# Patient Record
Sex: Female | Born: 1990
Health system: Southern US, Community
[De-identification: ages and names within clinical notes are randomized; demographics above are authoritative.]

## PROBLEM LIST (undated history)

## (undated) DIAGNOSIS — M5481 Occipital neuralgia: Secondary | ICD-10-CM

## (undated) DIAGNOSIS — G129 Spinal muscular atrophy, unspecified: Secondary | ICD-10-CM

## (undated) DIAGNOSIS — Z8742 Personal history of other diseases of the female genital tract: Secondary | ICD-10-CM

## (undated) DIAGNOSIS — Z8489 Family history of other specified conditions: Secondary | ICD-10-CM

## (undated) DIAGNOSIS — G35 Multiple sclerosis: Secondary | ICD-10-CM

## (undated) DIAGNOSIS — D649 Anemia, unspecified: Secondary | ICD-10-CM

## (undated) DIAGNOSIS — O09299 Supervision of pregnancy with other poor reproductive or obstetric history, unspecified trimester: Secondary | ICD-10-CM

## (undated) DIAGNOSIS — R87629 Unspecified abnormal cytological findings in specimens from vagina: Secondary | ICD-10-CM

## (undated) HISTORY — PX: ESOPHAGOGASTRODUODENOSCOPY (EGD) WITH PROPOFOL: SHX5813

## (undated) HISTORY — PX: NO PAST SURGERIES: SHX2092

---

## 2014-02-16 ENCOUNTER — Other Ambulatory Visit (HOSPITAL_COMMUNITY)
Admission: RE | Admit: 2014-02-16 | Discharge: 2014-02-16 | Disposition: A | Discharge status: Home / Self Care | Payer: BC Managed Care – PPO | Source: Ambulatory Visit | Attending: Obstetrics & Gynecology | Admitting: Obstetrics & Gynecology

## 2014-02-16 DIAGNOSIS — Z113 Encounter for screening for infections with a predominantly sexual mode of transmission: Secondary | ICD-10-CM | POA: Diagnosis present

## 2014-02-16 DIAGNOSIS — Z01411 Encounter for gynecological examination (general) (routine) with abnormal findings: Secondary | ICD-10-CM | POA: Diagnosis present

## 2014-10-28 ENCOUNTER — Other Ambulatory Visit: Payer: Self-pay | Admitting: Obstetrics & Gynecology

## 2014-10-28 ENCOUNTER — Other Ambulatory Visit (HOSPITAL_COMMUNITY)
Admission: RE | Admit: 2014-10-28 | Discharge: 2014-10-28 | Disposition: A | Discharge status: Home / Self Care | Payer: BC Managed Care – PPO | Source: Ambulatory Visit | Attending: Obstetrics & Gynecology | Admitting: Obstetrics & Gynecology

## 2014-10-28 DIAGNOSIS — N760 Acute vaginitis: Secondary | ICD-10-CM | POA: Insufficient documentation

## 2014-10-31 LAB — CERVICOVAGINAL ANCILLARY ONLY
BACTERIAL VAGINITIS: NEGATIVE
Candida vaginitis: NEGATIVE
Trichomonas: NEGATIVE

## 2015-09-22 DIAGNOSIS — G35 Multiple sclerosis: Secondary | ICD-10-CM | POA: Diagnosis not present

## 2015-11-28 DIAGNOSIS — H469 Unspecified optic neuritis: Secondary | ICD-10-CM | POA: Diagnosis not present

## 2015-12-27 DIAGNOSIS — A09 Infectious gastroenteritis and colitis, unspecified: Secondary | ICD-10-CM | POA: Diagnosis not present

## 2015-12-28 ENCOUNTER — Encounter (HOSPITAL_COMMUNITY): Payer: Self-pay | Admitting: Emergency Medicine

## 2015-12-28 ENCOUNTER — Emergency Department (HOSPITAL_COMMUNITY)
Admission: EM | Admit: 2015-12-28 | Discharge: 2015-12-28 | Disposition: A | Discharge status: Home / Self Care | Payer: BC Managed Care – PPO | Attending: Emergency Medicine | Admitting: Emergency Medicine

## 2015-12-28 DIAGNOSIS — R197 Diarrhea, unspecified: Secondary | ICD-10-CM | POA: Diagnosis not present

## 2015-12-28 DIAGNOSIS — R112 Nausea with vomiting, unspecified: Secondary | ICD-10-CM | POA: Diagnosis not present

## 2015-12-28 HISTORY — DX: Multiple sclerosis: G35

## 2015-12-28 LAB — URINALYSIS, ROUTINE W REFLEX MICROSCOPIC
Bilirubin Urine: NEGATIVE
GLUCOSE, UA: NEGATIVE mg/dL
HGB URINE DIPSTICK: NEGATIVE
Ketones, ur: NEGATIVE mg/dL
LEUKOCYTES UA: NEGATIVE
Nitrite: NEGATIVE
PH: 6 (ref 5.0–8.0)
Protein, ur: NEGATIVE mg/dL
SPECIFIC GRAVITY, URINE: 1.009 (ref 1.005–1.030)

## 2015-12-28 LAB — COMPREHENSIVE METABOLIC PANEL
ALT: 16 U/L (ref 14–54)
ANION GAP: 6 (ref 5–15)
AST: 22 U/L (ref 15–41)
Albumin: 4 g/dL (ref 3.5–5.0)
Alkaline Phosphatase: 52 U/L (ref 38–126)
BUN: 9 mg/dL (ref 6–20)
CALCIUM: 9.2 mg/dL (ref 8.9–10.3)
CO2: 24 mmol/L (ref 22–32)
Chloride: 107 mmol/L (ref 101–111)
Creatinine, Ser: 0.9 mg/dL (ref 0.44–1.00)
GFR calc non Af Amer: >60 mL/min (ref >60)
Glucose, Bld: 91 mg/dL (ref 65–99)
POTASSIUM: 4.1 mmol/L (ref 3.5–5.1)
Sodium: 137 mmol/L (ref 135–145)
Total Bilirubin: 0.6 mg/dL (ref 0.3–1.2)
Total Protein: 7.8 g/dL (ref 6.5–8.1)

## 2015-12-28 LAB — CBC
HEMATOCRIT: 36.4 % (ref 36.0–46.0)
HEMOGLOBIN: 11.8 g/dL — AB (ref 12.0–15.0)
MCH: 29.6 pg (ref 26.0–34.0)
MCHC: 32.4 g/dL (ref 30.0–36.0)
MCV: 91.2 fL (ref 78.0–100.0)
Platelets: 310 10*3/uL (ref 150–400)
RBC: 3.99 MIL/uL (ref 3.87–5.11)
RDW: 13.1 % (ref 11.5–15.5)
WBC: 6.8 10*3/uL (ref 4.0–10.5)

## 2015-12-28 LAB — I-STAT BETA HCG BLOOD, ED (MC, WL, AP ONLY): I-stat hCG, quantitative: <5 m[IU]/mL (ref <5)

## 2015-12-28 LAB — LIPASE, BLOOD: Lipase: 24 U/L (ref 11–51)

## 2015-12-28 MED ORDER — MECLIZINE HCL 25 MG PO TABS
25.0000 mg | ORAL_TABLET | Freq: Once | ORAL | Status: AC
  Administered 2015-12-28: 25 mg via ORAL
  Filled 2015-12-28: qty 1

## 2015-12-28 MED ORDER — ONDANSETRON 4 MG PO TBDP: ORAL_TABLET | ORAL | 0 refills | Status: DC

## 2015-12-28 MED ORDER — ONDANSETRON HCL 4 MG/2ML IJ SOLN
4.0000 mg | Freq: Once | INTRAMUSCULAR | Status: AC
  Administered 2015-12-28: 4 mg via INTRAVENOUS
  Filled 2015-12-28: qty 2

## 2015-12-28 MED ORDER — MECLIZINE HCL 25 MG PO TABS: 25.0000 mg | ORAL_TABLET | Freq: Three times a day (TID) | ORAL | 0 refills | Status: DC | PRN

## 2015-12-28 MED ORDER — SODIUM CHLORIDE 0.9 % IV BOLUS (SEPSIS)
1000.0000 mL | Freq: Once | INTRAVENOUS | Status: AC
  Administered 2015-12-28: 1000 mL via INTRAVENOUS

## 2015-12-28 NOTE — ED Provider Notes (Signed)
[facility]-EMERGENCY DEPT Provider Note   CSN: [id number] Arrival date & time: 12/28/15  1242     History   Chief Complaint Chief Complaint  Patient presents with  . Emesis    HPI Kimberly Strickland is a 25 y.o. female.  25 yo F with a chief complaint of nausea vomiting and diarrhea. Going on for the past 3 days. 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 She went to urgent care yesterday started on ciprofloxacin for possible traveler's diarrhea as well as azithromycin for possible cellulitis. Patient denies fevers or chills. Denies focal abdominal tenderness. Has some mild lower abdominal cramping that usually follow with diarrhea. Denies bilious or bloody emesis denies bloody or dark diarrhea. Has been feeling a little more lightheaded recently. Has a history of MS and is not sure if it is making her baseline dizziness worse.   The history is provided by the patient.  Emesis   This is a new problem. The current episode started more than 2 days ago. The problem occurs 2 to 4 times per day. The problem has not changed since onset.The emesis has an appearance of stomach contents. There has been no fever. Associated symptoms include diarrhea. Pertinent negatives include no arthralgias, no chills, no fever, no headaches and no myalgias.    Past Medical History:  Diagnosis Date  . MS (multiple sclerosis) (HCC)     There are no active problems to display for this patient.   No past surgical history on file.  OB History    No data available       Home Medications    Prior to Admission medications   Medication Sig Start Date End Date Taking? Authorizing Provider  azithromycin (ZITHROMAX) 500 MG tablet Take 500 mg by mouth daily. For 3 days, starting 09.20.2017. 12/27/15  Yes Historical Provider, MD  cholecalciferol (VITAMIN D) 1000 units tablet Take 5,000 Units by mouth daily.   Yes Historical Provider, MD  ciprofloxacin (CIPRO) 500 MG  tablet Take 500 mg by mouth 2 (two) times daily. For 7 days. 12/27/15   Historical Provider, MD  meclizine (ANTIVERT) 25 MG tablet Take 1 tablet (25 mg total) by mouth 3 (three) times daily as needed for dizziness. 12/28/15   Melene Plan, DO  ondansetron (ZOFRAN ODT) 4 MG disintegrating tablet 4mg  ODT q4 hours prn nausea/vomit 12/28/15   Melene Plan, DO    Family History No family history on file.  Social History Social History  Substance Use Topics  . Smoking status: Not on file  . Smokeless tobacco: Not on file  . Alcohol use Not on file     Allergies   Review of patient's allergies indicates no known allergies.   Review of Systems Review of Systems  Constitutional: Negative for chills and fever.  HENT: Negative for congestion and rhinorrhea.   Eyes: Negative for redness and visual disturbance.  Respiratory: Negative for shortness of breath and wheezing.   Cardiovascular: Negative for chest pain and palpitations.  Gastrointestinal: Positive for diarrhea, nausea and vomiting.  Genitourinary: Negative for dysuria and urgency.  Musculoskeletal: Negative for arthralgias and myalgias.  Skin: Negative for pallor and wound.  Neurological: Negative for dizziness and headaches.     Physical Exam Updated Vital Signs BP 100/61 (BP Location: Right Arm)   Pulse 67   Temp 98.1 F (36.7 C) (Oral)   Resp 18   Ht 5\' 8"  (1.727 m)   Wt 180 lb (81.6 kg)  LMP 12/21/2015   SpO2 100%   BMI 27.37 kg/m   Physical Exam  Constitutional: She is oriented to person, place, and time. She appears well-developed and well-nourished. No distress.  HENT:  Head: Normocephalic and atraumatic.  Eyes: EOM are normal. Pupils are equal, round, and reactive to light.  Neck: Normal range of motion. Neck supple.  Cardiovascular: Normal rate and regular rhythm.  Exam reveals no gallop and no friction rub.   No murmur heard. Pulmonary/Chest: Effort normal. She has no wheezes. She has no rales.  Abdominal:  Soft. She exhibits no distension and no mass. There is no tenderness. There is no guarding.  Mild diffuse lower abdominal tenderness.  Musculoskeletal: She exhibits no edema or tenderness.  Neurological: She is alert and oriented to person, place, and time.  Skin: Skin is warm and dry. She is not diaphoretic.  Psychiatric: She has a normal mood and affect. Her behavior is normal.  Nursing note and vitals reviewed.    ED Treatments / Results  Labs (all labs ordered are listed, but only abnormal results are displayed) Labs Reviewed  CBC - Abnormal; Notable for the following:       Result Value   Hemoglobin 11.8 (*)    All other components within normal limits  LIPASE, BLOOD  COMPREHENSIVE METABOLIC PANEL  URINALYSIS, ROUTINE W REFLEX MICROSCOPIC (NOT AT [facility])  I-STAT BETA HCG BLOOD, ED ([facility], [facility], [facility] ONLY)    EKG  EKG Interpretation None       Radiology No results found.  Procedures Procedures (including critical care time)  Medications Ordered in ED Medications  ondansetron (ZOFRAN) injection 4 mg (4 mg Intravenous Given 12/28/15 1732)  sodium chloride 0.9 % bolus 1,000 mL (1,000 mLs Intravenous New Bag/Given 12/28/15 1731)  meclizine (ANTIVERT) tablet 25 mg (25 mg Oral Given 12/28/15 1748)     Initial Impression / Assessment and Plan / ED Course  I have reviewed the triage vital signs and the nursing notes.  Pertinent labs & imaging results that were available during my care of the patient were reviewed by me and considered in my medical decision making (see chart for details).  Clinical Course    25 yo F With a chief complaint of nausea vomiting and diarrhea. I suspect this is a viral illness based on multiple sick contacts in her class. Will give Zofran IV fluids. Patient had labs done in triage that were unremarkable.  Tolerating by mouth well in the ED. Feeling significantly better after IV fluids. Discharge home.  6:53 PM:  I have discussed the  diagnosis/risks/treatment options with the patient and family and believe the pt to be eligible for discharge home to follow-up with PCP. We also discussed returning to the ED immediately if new or worsening sx occur. We discussed the sx which are most concerning (e.g., sudden worsening pain, fever, inability to tolerate by mouth) that necessitate immediate return. Medications administered to the patient during their visit and any new prescriptions provided to the patient are listed below.  Medications given during this visit Medications  ondansetron (ZOFRAN) injection 4 mg (4 mg Intravenous Given 12/28/15 1732)  sodium chloride 0.9 % bolus 1,000 mL (1,000 mLs Intravenous New Bag/Given 12/28/15 1731)  meclizine (ANTIVERT) tablet 25 mg (25 mg Oral Given 12/28/15 1748)     The patient appears reasonably screen and/or stabilized for discharge and I doubt any other medical condition or other [facility] requiring further screening, evaluation, or treatment in the ED at this time prior  to discharge.    Final Clinical Impressions(s) / ED Diagnoses   Final diagnoses:  Nausea vomiting and diarrhea    New Prescriptions New Prescriptions   MECLIZINE (ANTIVERT) 25 MG TABLET    Take 1 tablet (25 mg total) by mouth 3 (three) times daily as needed for dizziness.   ONDANSETRON (ZOFRAN ODT) 4 MG DISINTEGRATING TABLET    4mg  ODT q4 hours prn nausea/vomit     Melene Planan Marni Franzoni, DO 12/28/15 1853

## 2015-12-28 NOTE — Progress Notes (Signed)
Patient noted to have [facility] without a pcp.  EDCM spoke to patient at bedside.  EDCM provided patient with a list of [employer] providers within a twenty five mile radius of her zip code [id number]. Patient thankful for resources.  No further EDCM needs at this time.

## 2015-12-28 NOTE — ED Triage Notes (Signed)
Per pt, states vomiting for 3 days-unable to keep solids down-states she is not sure if this is related to her MS-abdominal cramping

## 2016-01-15 DIAGNOSIS — R200 Anesthesia of skin: Secondary | ICD-10-CM | POA: Diagnosis not present

## 2016-01-15 DIAGNOSIS — R29898 Other symptoms and signs involving the musculoskeletal system: Secondary | ICD-10-CM | POA: Diagnosis not present

## 2016-01-15 DIAGNOSIS — Z79899 Other long term (current) drug therapy: Secondary | ICD-10-CM | POA: Diagnosis not present

## 2016-01-15 DIAGNOSIS — H532 Diplopia: Secondary | ICD-10-CM | POA: Diagnosis not present

## 2016-01-15 DIAGNOSIS — R202 Paresthesia of skin: Secondary | ICD-10-CM | POA: Diagnosis not present

## 2016-01-15 DIAGNOSIS — G35 Multiple sclerosis: Secondary | ICD-10-CM | POA: Diagnosis not present

## 2016-02-10 DIAGNOSIS — G35 Multiple sclerosis: Secondary | ICD-10-CM | POA: Diagnosis not present

## 2016-02-13 DIAGNOSIS — H532 Diplopia: Secondary | ICD-10-CM | POA: Diagnosis not present

## 2016-02-13 DIAGNOSIS — G35 Multiple sclerosis: Secondary | ICD-10-CM | POA: Diagnosis not present

## 2016-02-13 DIAGNOSIS — Z79899 Other long term (current) drug therapy: Secondary | ICD-10-CM | POA: Diagnosis not present

## 2016-02-13 DIAGNOSIS — R202 Paresthesia of skin: Secondary | ICD-10-CM | POA: Diagnosis not present

## 2016-02-13 DIAGNOSIS — R29898 Other symptoms and signs involving the musculoskeletal system: Secondary | ICD-10-CM | POA: Diagnosis not present

## 2016-02-13 DIAGNOSIS — R200 Anesthesia of skin: Secondary | ICD-10-CM | POA: Diagnosis not present

## 2016-02-15 DIAGNOSIS — G35 Multiple sclerosis: Secondary | ICD-10-CM | POA: Diagnosis not present

## 2016-04-11 IMAGING — US US ABDOMEN LIMITED
1 series · 14 of 25 positions shown · non-contrast
Comparison: None.

CLINICAL DATA: Nausea, vomiting

EXAM:
ULTRASOUND ABDOMEN LIMITED RIGHT UPPER QUADRANT

[Series 1: us abdomen limited · 0.17mm/px · 14 of 35 slices shown]
[im 1/35]
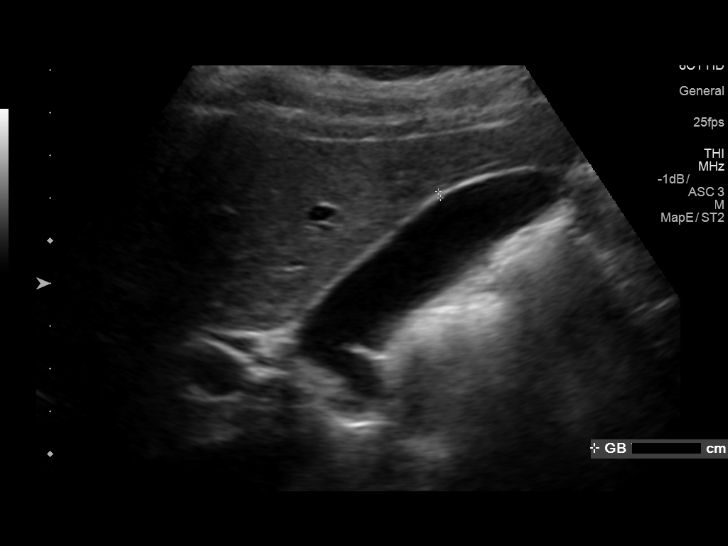
[im 3/35]
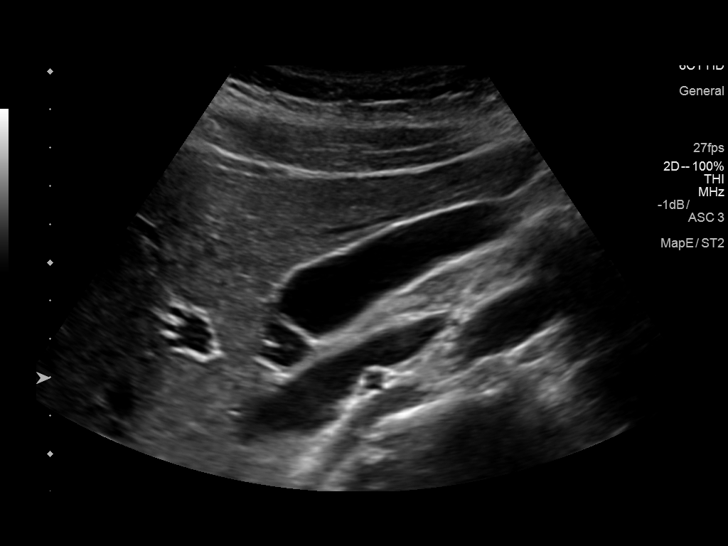
[im 6/35]
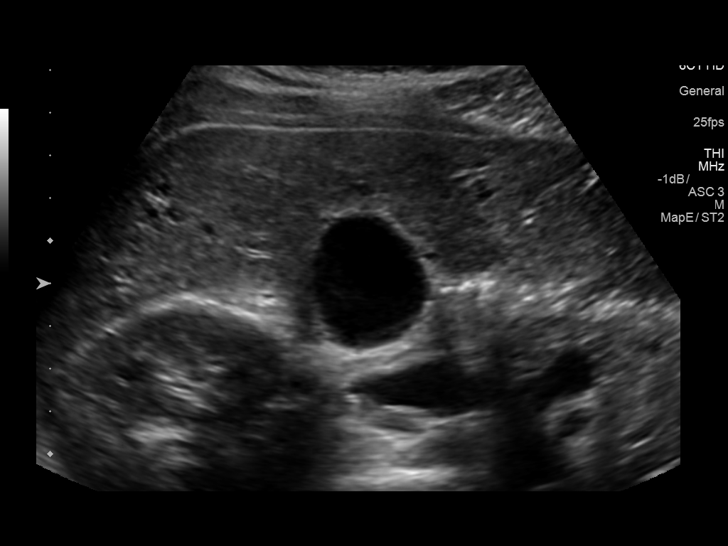
[im 9/35]
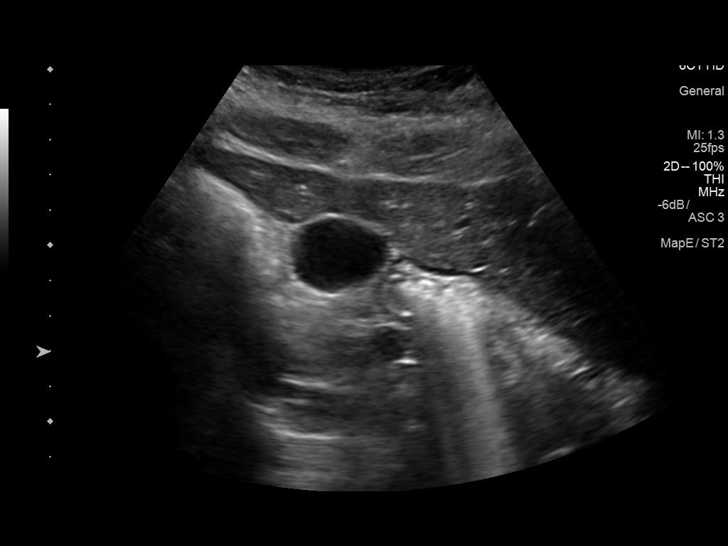
[im 12/35]
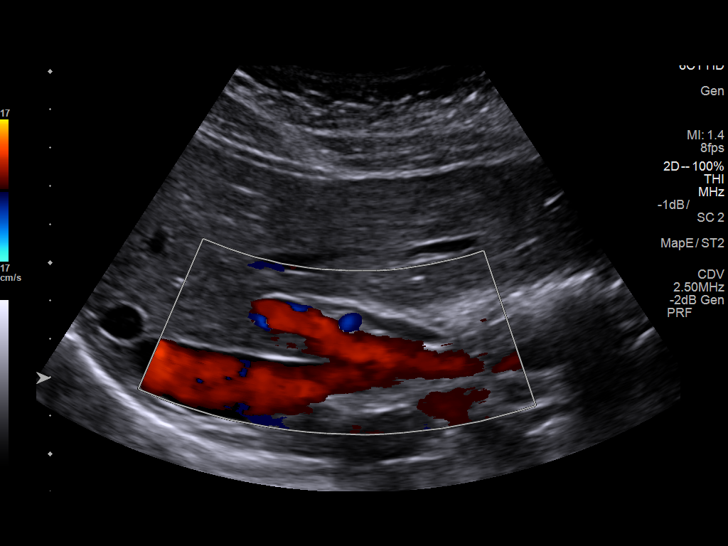
[im 13/35]
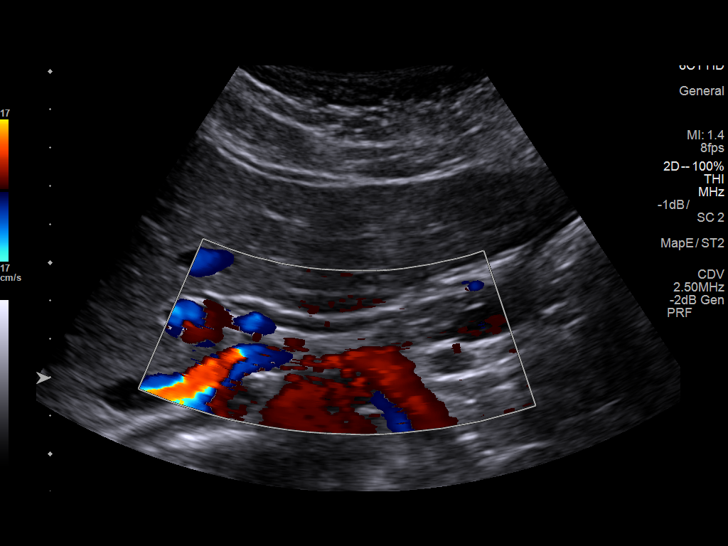
[im 16/35]
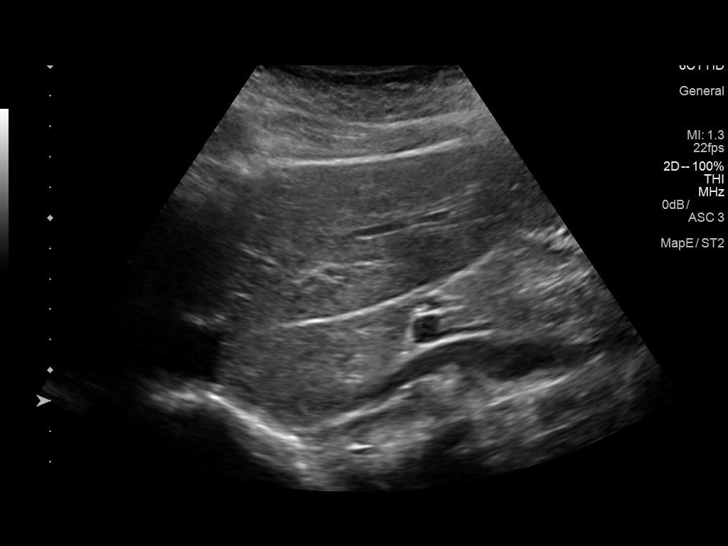
[im 19/35]
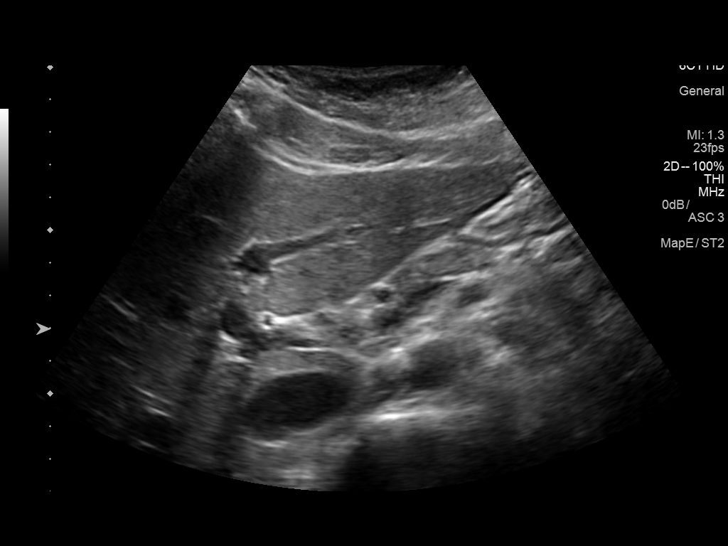
[im 22/35]
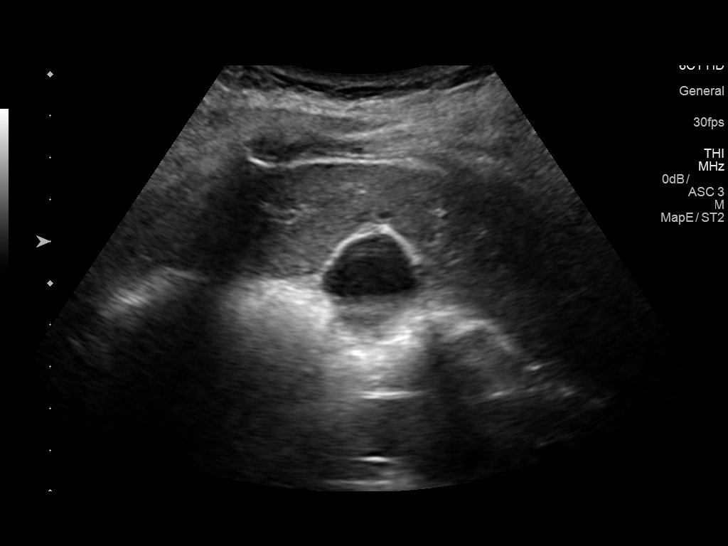
[im 23/35]
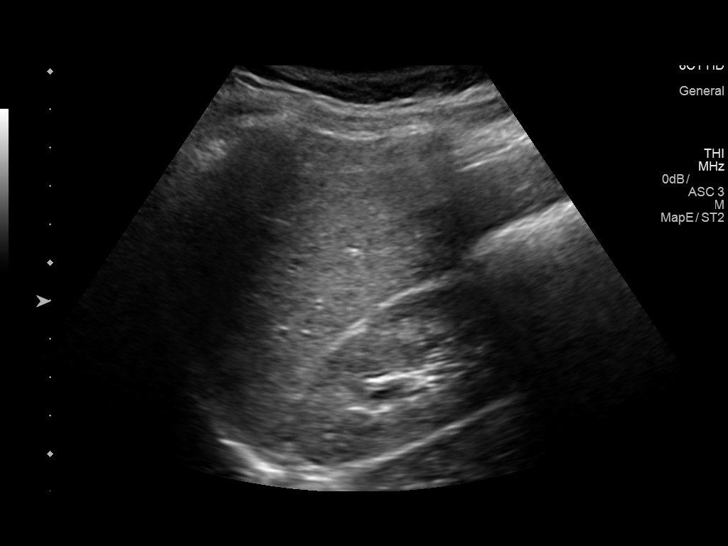
[im 26/35]
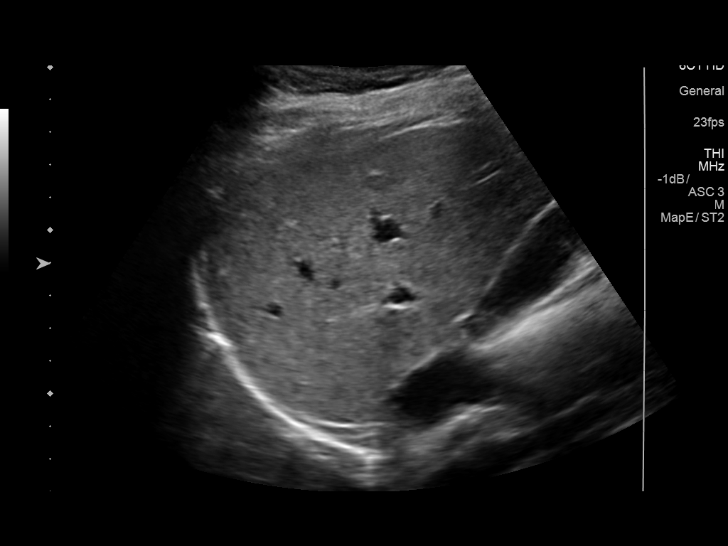
[im 29/35]
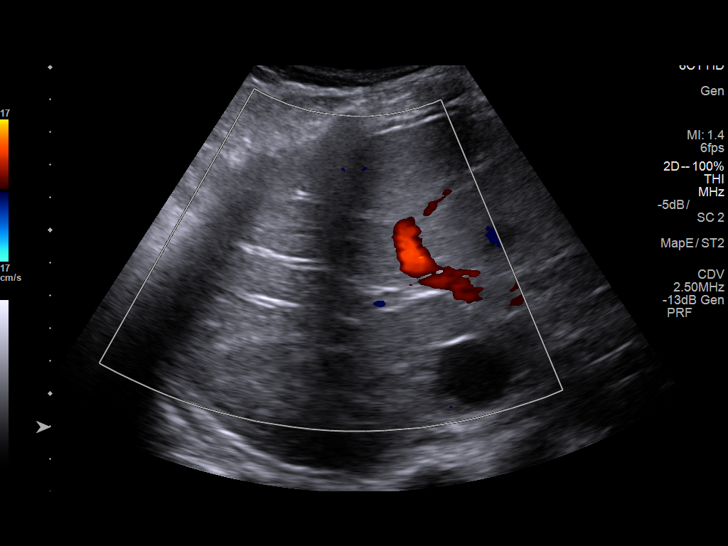
[im 32/35]
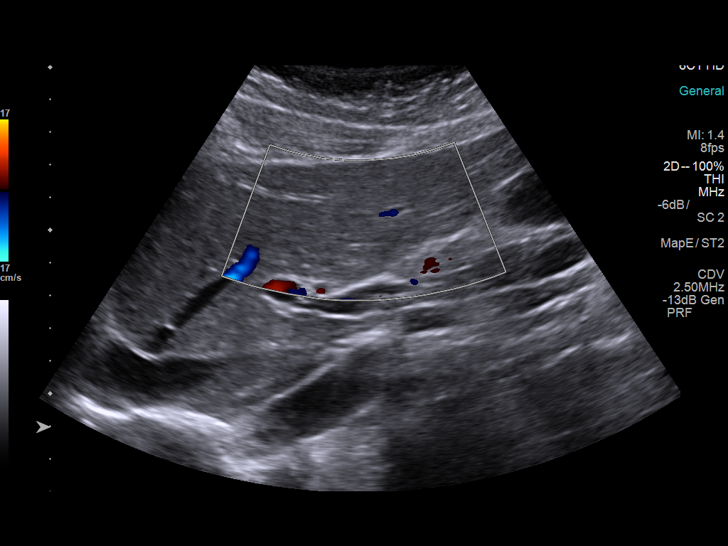
[im 35/35]
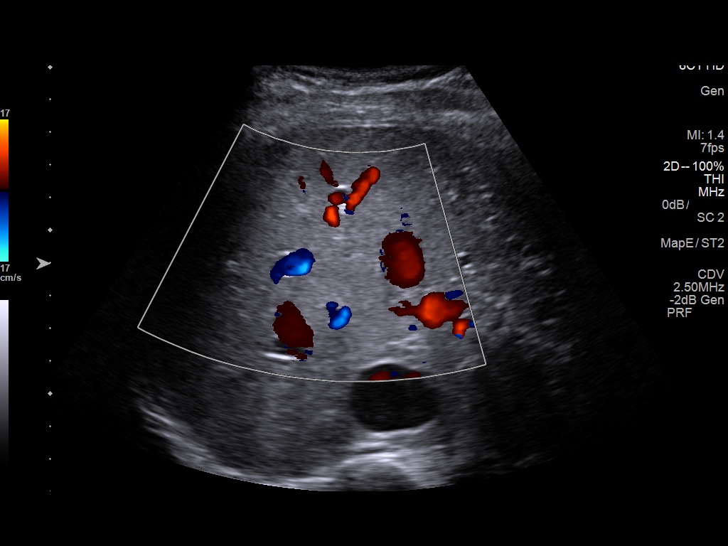

[14 of 25 positions shown; findings below may reference images not displayed]

FINDINGS: Gallbladder:

No gallstones or wall thickening visualized. No sonographic Murphy
sign noted by sonographer.

Common bile duct:

Diameter: Normal caliber, 3-4 mm.

Liver:

No focal lesion identified. Within normal limits in parenchymal
echogenicity. Mild intrahepatic biliary ductal dilatation.
Hepatopetal flow in the main portal vein.
IMPRESSION: Mild intrahepatic biliary duct dilatation. Recommend clinical
correlation with LFTs. Common bile duct is normal caliber.

## 2016-04-16 DIAGNOSIS — G35 Multiple sclerosis: Secondary | ICD-10-CM | POA: Diagnosis not present

## 2016-06-19 DIAGNOSIS — Z713 Dietary counseling and surveillance: Secondary | ICD-10-CM | POA: Diagnosis not present

## 2016-08-14 DIAGNOSIS — G35 Multiple sclerosis: Secondary | ICD-10-CM | POA: Diagnosis not present

## 2016-09-10 DIAGNOSIS — R1030 Lower abdominal pain, unspecified: Secondary | ICD-10-CM | POA: Diagnosis not present

## 2016-09-10 DIAGNOSIS — Z01411 Encounter for gynecological examination (general) (routine) with abnormal findings: Secondary | ICD-10-CM | POA: Diagnosis not present

## 2016-09-10 DIAGNOSIS — N760 Acute vaginitis: Secondary | ICD-10-CM | POA: Diagnosis not present

## 2016-09-10 DIAGNOSIS — G35 Multiple sclerosis: Secondary | ICD-10-CM | POA: Diagnosis not present

## 2016-09-10 DIAGNOSIS — Z01419 Encounter for gynecological examination (general) (routine) without abnormal findings: Secondary | ICD-10-CM | POA: Diagnosis not present

## 2016-09-10 DIAGNOSIS — D649 Anemia, unspecified: Secondary | ICD-10-CM | POA: Diagnosis not present

## 2016-09-10 DIAGNOSIS — Z6828 Body mass index (BMI) 28.0-28.9, adult: Secondary | ICD-10-CM | POA: Diagnosis not present

## 2016-09-10 DIAGNOSIS — R110 Nausea: Secondary | ICD-10-CM | POA: Diagnosis not present

## 2016-09-10 DIAGNOSIS — R269 Unspecified abnormalities of gait and mobility: Secondary | ICD-10-CM | POA: Diagnosis not present

## 2016-09-10 DIAGNOSIS — Z113 Encounter for screening for infections with a predominantly sexual mode of transmission: Secondary | ICD-10-CM | POA: Diagnosis not present

## 2016-09-10 DIAGNOSIS — N941 Unspecified dyspareunia: Secondary | ICD-10-CM | POA: Diagnosis not present

## 2016-09-10 DIAGNOSIS — R42 Dizziness and giddiness: Secondary | ICD-10-CM | POA: Diagnosis not present

## 2016-09-24 DIAGNOSIS — G35 Multiple sclerosis: Secondary | ICD-10-CM | POA: Diagnosis not present

## 2016-09-24 DIAGNOSIS — R001 Bradycardia, unspecified: Secondary | ICD-10-CM | POA: Diagnosis not present

## 2016-09-25 ENCOUNTER — Other Ambulatory Visit: Payer: Self-pay | Admitting: Physician Assistant

## 2016-09-25 DIAGNOSIS — G43A0 Cyclical vomiting, not intractable: Secondary | ICD-10-CM | POA: Diagnosis not present

## 2016-09-25 DIAGNOSIS — K219 Gastro-esophageal reflux disease without esophagitis: Secondary | ICD-10-CM | POA: Diagnosis not present

## 2016-09-25 DIAGNOSIS — R10811 Right upper quadrant abdominal tenderness: Secondary | ICD-10-CM

## 2016-09-25 DIAGNOSIS — K5904 Chronic idiopathic constipation: Secondary | ICD-10-CM | POA: Diagnosis not present

## 2016-09-25 DIAGNOSIS — R001 Bradycardia, unspecified: Secondary | ICD-10-CM | POA: Diagnosis not present

## 2016-09-25 DIAGNOSIS — D649 Anemia, unspecified: Secondary | ICD-10-CM | POA: Diagnosis not present

## 2016-10-01 DIAGNOSIS — R87610 Atypical squamous cells of undetermined significance on cytologic smear of cervix (ASC-US): Secondary | ICD-10-CM | POA: Diagnosis not present

## 2016-10-01 DIAGNOSIS — N870 Mild cervical dysplasia: Secondary | ICD-10-CM | POA: Diagnosis not present

## 2016-10-01 DIAGNOSIS — N72 Inflammatory disease of cervix uteri: Secondary | ICD-10-CM | POA: Diagnosis not present

## 2016-10-01 DIAGNOSIS — R87810 Cervical high risk human papillomavirus (HPV) DNA test positive: Secondary | ICD-10-CM | POA: Diagnosis not present

## 2016-10-02 ENCOUNTER — Ambulatory Visit
Admission: RE | Admit: 2016-10-02 | Discharge: 2016-10-02 | Disposition: A | Discharge status: Home / Self Care | Payer: BC Managed Care – PPO | Source: Ambulatory Visit | Attending: Physician Assistant | Admitting: Physician Assistant

## 2016-10-02 DIAGNOSIS — R10811 Right upper quadrant abdominal tenderness: Secondary | ICD-10-CM

## 2016-10-02 DIAGNOSIS — K838 Other specified diseases of biliary tract: Secondary | ICD-10-CM | POA: Diagnosis not present

## 2016-10-02 DIAGNOSIS — G43A0 Cyclical vomiting, not intractable: Secondary | ICD-10-CM

## 2016-10-04 DIAGNOSIS — R112 Nausea with vomiting, unspecified: Secondary | ICD-10-CM | POA: Diagnosis not present

## 2016-10-18 DIAGNOSIS — G43A0 Cyclical vomiting, not intractable: Secondary | ICD-10-CM | POA: Diagnosis not present

## 2016-10-18 DIAGNOSIS — K5904 Chronic idiopathic constipation: Secondary | ICD-10-CM | POA: Diagnosis not present

## 2016-10-18 DIAGNOSIS — K219 Gastro-esophageal reflux disease without esophagitis: Secondary | ICD-10-CM | POA: Diagnosis not present

## 2016-11-07 DIAGNOSIS — R42 Dizziness and giddiness: Secondary | ICD-10-CM | POA: Diagnosis not present

## 2016-11-14 DIAGNOSIS — G35 Multiple sclerosis: Secondary | ICD-10-CM | POA: Diagnosis not present

## 2016-12-25 ENCOUNTER — Other Ambulatory Visit (HOSPITAL_COMMUNITY): Payer: Self-pay | Admitting: Gastroenterology

## 2016-12-25 DIAGNOSIS — K5904 Chronic idiopathic constipation: Secondary | ICD-10-CM | POA: Diagnosis not present

## 2016-12-25 DIAGNOSIS — R112 Nausea with vomiting, unspecified: Secondary | ICD-10-CM | POA: Diagnosis not present

## 2016-12-25 DIAGNOSIS — R140 Abdominal distension (gaseous): Secondary | ICD-10-CM | POA: Diagnosis not present

## 2017-01-07 ENCOUNTER — Encounter (HOSPITAL_COMMUNITY)
Admission: RE | Admit: 2017-01-07 | Discharge: 2017-01-07 | Disposition: A | Discharge status: Home / Self Care | Payer: BC Managed Care – PPO | Source: Ambulatory Visit | Attending: Gastroenterology | Admitting: Gastroenterology

## 2017-01-07 DIAGNOSIS — R112 Nausea with vomiting, unspecified: Secondary | ICD-10-CM | POA: Insufficient documentation

## 2017-01-07 IMAGING — NM NM GASTRIC EMPTYING
5 series · 5 of 5 positions shown · non-contrast
Comparison: None.

CLINICAL DATA: Nausea, vomiting, and bloating since [DATE].

EXAM:
NUCLEAR MEDICINE GASTRIC EMPTYING SCAN
TECHNIQUE: After oral ingestion of radiolabeled meal, sequential abdominal
images were obtained for 4 hours. Percentage of activity emptying
the stomach was calculated at 1 hour, 2 hour, 3 hour, and 4 hours.
RADIOPHARMACEUTICALS:  2.0 mCi [JF] sulfur colloid in standardized
meal

[Series 1: 0 min · 4.14mm/px · 1 of 1 slices shown]
[im 1/1]
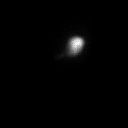

[Series 2: 1 hr · 4.14mm/px · 1 of 1 slices shown]
[im 1/1]
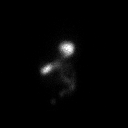

[Series 3: 2 hr · 4.14mm/px · 1 of 1 slices shown]
[im 1/1]
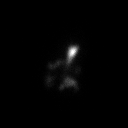

[Series 4: 3 hr · 4.14mm/px · 1 of 1 slices shown]
[im 1/1]
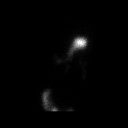

[Series 5: 4 hr · 4.14mm/px · 1 of 1 slices shown]
[im 1/1]
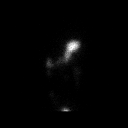

[5 of 5 positions shown; findings below may reference images not displayed]

FINDINGS: Expected location of the stomach in the left upper quadrant.
Ingested meal empties the stomach gradually over the course of the
study.

8% emptied at 1 hr ( normal >= 10%)

28% emptied at 2 hr ( normal >= 40%)

36% emptied at 3 hr ( normal >= 70%)

41% emptied at 4 hr ( normal >= 90%)
IMPRESSION: Delay gastric emptying study.

## 2017-01-07 MED ORDER — TECHNETIUM TC 99M SULFUR COLLOID
2.0000 | Freq: Once | INTRAVENOUS | Status: AC | PRN
  Administered 2017-01-07: 2 via ORAL

## 2017-01-08 DIAGNOSIS — R112 Nausea with vomiting, unspecified: Secondary | ICD-10-CM | POA: Diagnosis not present

## 2017-01-28 DIAGNOSIS — Z3685 Encounter for antenatal screening for Streptococcus B: Secondary | ICD-10-CM | POA: Diagnosis not present

## 2017-01-28 DIAGNOSIS — Z3401 Encounter for supervision of normal first pregnancy, first trimester: Secondary | ICD-10-CM | POA: Diagnosis not present

## 2017-01-28 DIAGNOSIS — Z3A08 8 weeks gestation of pregnancy: Secondary | ICD-10-CM | POA: Diagnosis not present

## 2017-01-28 DIAGNOSIS — O3680X0 Pregnancy with inconclusive fetal viability, not applicable or unspecified: Secondary | ICD-10-CM | POA: Diagnosis not present

## 2017-01-28 DIAGNOSIS — Z3480 Encounter for supervision of other normal pregnancy, unspecified trimester: Secondary | ICD-10-CM | POA: Diagnosis not present

## 2017-01-28 LAB — OB RESULTS CONSOLE RPR: RPR: NONREACTIVE

## 2017-01-28 LAB — OB RESULTS CONSOLE RUBELLA ANTIBODY, IGM: Rubella: IMMUNE

## 2017-01-28 LAB — OB RESULTS CONSOLE ABO/RH: RH TYPE: POSITIVE

## 2017-01-28 LAB — OB RESULTS CONSOLE HIV ANTIBODY (ROUTINE TESTING): HIV: NONREACTIVE

## 2017-01-28 LAB — OB RESULTS CONSOLE HEPATITIS B SURFACE ANTIGEN: Hepatitis B Surface Ag: NEGATIVE

## 2017-01-28 LAB — OB RESULTS CONSOLE ANTIBODY SCREEN: Antibody Screen: NEGATIVE

## 2017-02-17 DIAGNOSIS — Z3401 Encounter for supervision of normal first pregnancy, first trimester: Secondary | ICD-10-CM | POA: Diagnosis not present

## 2017-02-17 DIAGNOSIS — Z113 Encounter for screening for infections with a predominantly sexual mode of transmission: Secondary | ICD-10-CM | POA: Diagnosis not present

## 2017-03-04 DIAGNOSIS — Z3491 Encounter for supervision of normal pregnancy, unspecified, first trimester: Secondary | ICD-10-CM | POA: Diagnosis not present

## 2017-03-04 DIAGNOSIS — Z3A13 13 weeks gestation of pregnancy: Secondary | ICD-10-CM | POA: Diagnosis not present

## 2017-03-04 DIAGNOSIS — Z3682 Encounter for antenatal screening for nuchal translucency: Secondary | ICD-10-CM | POA: Diagnosis not present

## 2017-03-04 DIAGNOSIS — Z36 Encounter for antenatal screening for chromosomal anomalies: Secondary | ICD-10-CM | POA: Diagnosis not present

## 2017-03-08 ENCOUNTER — Telehealth: Payer: Self-pay | Admitting: Hematology

## 2017-03-08 ENCOUNTER — Encounter: Payer: Self-pay | Admitting: Hematology

## 2017-03-08 NOTE — Telephone Encounter (Signed)
Appt has been scheduled for the pt to see Dr. Candise CheKale on 12/17 at 10am. Letter faxed to Clydie BraunKaren at [facility] to notify the pt. Letter also mailed to the pt.

## 2017-03-24 ENCOUNTER — Ambulatory Visit: Payer: BLUE CROSS/BLUE SHIELD | Admitting: Hematology

## 2017-03-24 ENCOUNTER — Encounter: Payer: Self-pay | Admitting: Hematology

## 2017-03-24 VITALS — BP 93/59 | HR 86 | Temp 98.9°F | Resp 20 | Wt 176.8 lb

## 2017-03-24 DIAGNOSIS — D699 Hemorrhagic condition, unspecified: Secondary | ICD-10-CM

## 2017-03-24 DIAGNOSIS — Z3A16 16 weeks gestation of pregnancy: Secondary | ICD-10-CM

## 2017-03-24 DIAGNOSIS — N921 Excessive and frequent menstruation with irregular cycle: Secondary | ICD-10-CM

## 2017-03-24 DIAGNOSIS — Z3480 Encounter for supervision of other normal pregnancy, unspecified trimester: Secondary | ICD-10-CM | POA: Diagnosis not present

## 2017-03-24 NOTE — Progress Notes (Signed)
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   She reports that she was previously told in passing that she might have a bleeding issue and possible low FVIII levels by her doctor at [facility] medical practice while being followed for her [place] in [place], however, recently her obstetrician was concerned about the inheritability in her current pregnancy and wanted her to be followed by a hematologist.  She notes that she has never previously been evaluated by a hematologist.  Currently she is asymptomatic in this regard, and she has never had an issue with bleeding or bruising issues in her life. She has no known familial history with bleeding or clotting issues within her life. She has also been screened for the sickle cell trait and she does not carry this.  The patient reports that she is currently [redacted]wks pregnant and she states that overall this pregnancy has been normal mostly and she only has mild morning sickness currently. Prior to this pregnancy, she reports that she has always had issues with heavier periods, particularly from the 2nd-5th day and they typically last 7 days.   She denies any prior spontaneous epistaxis, GI bleeding, gingival bleeding, joint or skin bleeding, or any other bleeding issues. She has never undergone surgery prior to this pregnancy. No dental extractions.  She was formally diagnosed with MS in 2010. She reports that this initially began with numbness and paraesthesias within her hands and this has continued and only mildly worsened throughout the course of  her disease. This is well controlled with Copaxone daily. She is currently followed by a care team at both [facility] and [facility].   On review of systems, pt denies fever, chills, rash, mouth sores, weight loss, decreased appetite, urinary complaints. Denies back pain. Pt denies abdominal pain, nausea, vomiting. Pertinent positives are listed within the above HPI.   MEDICAL HISTORY:  Past Medical History:  Diagnosis Date  . MS (multiple sclerosis) (HCC)    SURGICAL HISTORY: History reviewed. No pertinent surgical history.  SOCIAL HISTORY: Social History   Socioeconomic History  . Marital status: Single    Spouse name: Not on file  . Number of children: Not on file  . Years of education: Not on file  . Highest education level: Not on file  Social Needs  . Financial resource strain: Not on file  . Food insecurity - worry: Not on file  . Food insecurity - inability: Not on file  . Transportation needs - medical: Not on file  . Transportation needs - non-medical: Not on file  Occupational History  . Not on file  Tobacco Use  . Smoking status: Former Smoker    Types: Cigars    Last attempt to quit: 2014    Years since quitting: 4.9  . Smokeless tobacco: Never Used  Substance and Sexual Activity  . Alcohol use: No    Frequency: Never  . Drug use: No  . Sexual activity: Yes    Birth control/protection: Other-see comments    Comment: Pregnancy  Other Topics Concern  . Not on file  Social History Narrative  .  Not on file   FAMILY HISTORY: History reviewed. No pertinent family history.  ALLERGIES:  has No Known Allergies.  MEDICATIONS:  Current Outpatient Medications  Medication Sig Dispense Refill  . Doxylamine-Pyridoxine (BONJESTA PO) Take 20 mg by mouth daily.    Marland Kitchen. azithromycin (ZITHROMAX) 500 MG tablet Take 500 mg by mouth daily. For 3 days, starting 09.20.2017.    . cholecalciferol (VITAMIN D) 1000 units tablet Take 5,000 Units by mouth daily.    .  ciprofloxacin (CIPRO) 500 MG tablet Take 500 mg by mouth 2 (two) times daily. For 7 days.    . meclizine (ANTIVERT) 25 MG tablet Take 1 tablet (25 mg total) by mouth 3 (three) times daily as needed for dizziness. (Patient not taking: Reported on 03/24/2017) 30 tablet 0  . ondansetron (ZOFRAN ODT) 4 MG disintegrating tablet 4mg  ODT q4 hours prn nausea/vomit (Patient not taking: Reported on 03/24/2017) 20 tablet 0   No current facility-administered medications for this visit.     REVIEW OF SYSTEMS:    A 10+ POINT REVIEW OF SYSTEMS WAS OBTAINED including neurology, dermatology, psychiatry, cardiac, respiratory, lymph, extremities, GI, GU, Musculoskeletal, constitutional, breasts, reproductive, HEENT.  All pertinent positives are noted in the HPI.  All others are negative.  PHYSICAL EXAMINATION: ECOG PERFORMANCE STATUS: 0 - Asymptomatic  . Vitals:   03/24/17 1031  BP: (!) 93/59  Pulse: 86  Resp: 20  Temp: 98.9 F (37.2 C)  SpO2: 100%   Filed Weights   03/24/17 1031  Weight: 176 lb 12.8 oz (80.2 kg)   .Body mass index is 26.88 kg/m.  GENERAL:alert, in no acute distress and comfortable SKIN: no acute rashes, no significant lesions EYES: conjunctiva are pink and non-injected, sclera anicteric OROPHARYNX: MMM, no exudates, no oropharyngeal erythema or ulceration NECK: supple, no JVD LYMPH:  no palpable lymphadenopathy in the cervical, axillary or inguinal regions LUNGS: clear to auscultation b/l with normal respiratory effort HEART: regular rate & rhythm ABDOMEN:  normoactive bowel sounds , non tender, not distended. Extremity: no pedal edema PSYCH: alert & oriented x 3 with fluent speech NEURO: no focal motor/sensory deficits  LABORATORY DATA:  I have reviewed the data as listed  . CBC Latest Ref Rng & Units 12/28/2015  WBC 4.0 - 10.5 K/uL 6.8  Hemoglobin 12.0 - 15.0 g/dL 11.8(L)  Hematocrit 36.0 - 46.0 % 36.4  Platelets 150 - 400 K/uL 310    . CMP Latest Ref Rng &  Units 12/28/2015  Glucose 65 - 99 mg/dL 91  BUN 6 - 20 mg/dL 9  Creatinine 1.610.44 - 0.961.00 mg/dL 0.450.90  Sodium 409135 - 811145 mmol/L 137  Potassium 3.5 - 5.1 mmol/L 4.1  Chloride 101 - 111 mmol/L 107  CO2 22 - 32 mmol/L 24  Calcium 8.9 - 10.3 mg/dL 9.2  Total Protein 6.5 - 8.1 g/dL 7.8  Total Bilirubin 0.3 - 1.2 mg/dL 0.6  Alkaline Phos 38 - 126 U/L 52  AST 15 - 41 U/L 22  ALT 14 - 54 U/L 16     RADIOGRAPHIC STUDIES: I have personally reviewed the radiological images as listed and agreed with the findings in the report. No results found.  ASSESSMENT & PLAN:  This is a wonderful 26 y.o. 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 -She has been referred to us by her OBGYN for further evaluation regarding this.  We do not have  her prior records from her doctors in [place].  -She is currently asymptomatic and has never had any issues with bruising/bleeding outside of heavier menses in the past. No forms of spontaneous bleeding and she does not have a family history of bleeding issues.  With this in mind, and with a lower bleeding risk score, I suspect that that she is more likely a carrier of this if anything at all.  Plan -We will order bleeding disorder screening to evaluate this further. If this is negative then we will follow up on an prn basis.  -she was recommended to request her OSH record reflecting any previous workup or concerns for bleeding issues. -Labs tomorrow . Orders Placed This Encounter  Procedures  . CBC & Diff and Retic    Standing Status:   Future    Standing Expiration Date:   03/24/2018  . Comprehensive metabolic panel    Standing Status:   Future    Standing Expiration Date:   03/24/2018  . APTT    Standing Status:   Future    Standing Expiration Date:   03/24/2018  . Protime-INR    Standing Status:   Future    Standing Expiration  Date:   03/24/2018  . Von Willebrand panel    Standing Status:   Future    Standing Expiration Date:   03/24/2018  . Factor 8 assay    Standing Status:   Future    Standing Expiration Date:   03/24/2018    RTC with Dr Candise Che prn based on results of labs testing.   All of the patients questions were answered with apparent satisfaction. The patient knows to call the clinic with any problems, questions or concerns.  I spent 40 minutes counseling the patient face to face. The total time spent in the appointment was 50 minutes and more than 50% was on counseling and direct patient cares.    Wyvonnia Lora MD MS AAHIVMS [facility] Hematology/Oncology Physician [facility]  (Office):       [phone] (Work cell):  [phone] (Fax):           [phone]  03/24/2017 10:46 AM   This document serves as a record of services personally performed by Wyvonnia Lora, MD. It was created on his behalf by Rosario Adie, a trained medical scribe. The creation of this record is based on the scribe's personal observations and the provider's statements to them.   .I have reviewed the above documentation for accuracy and completeness, and I agree with the above. Johney Maine MD MS

## 2017-03-26 ENCOUNTER — Telehealth: Payer: Self-pay | Admitting: Hematology

## 2017-03-26 NOTE — Telephone Encounter (Signed)
Per 12/17 los - RTC with Dr Candise Che prn

## 2017-04-08 NOTE — L&D Delivery Note (Addendum)
Delivery Note Delivery Note CTSP for precipitous delivery by CNM.  I arrived 1 minute after call. SVD viable female Apgars 9,9 over 1st degree ML lac.  I took over for the following: Placenta delivered spontaneously intact with 3VC. Repair with 2-0 Chromic with good support and hemostasis noted.  R/V exam confirms.  PH art was sent.   Mother and baby to couplet care and are doing well.  EBL 200cc  Candice Camp, MD

## 2017-04-08 NOTE — L&D Delivery Note (Signed)
Delivery Note Called by RN for standby delivery for Dr. Rana Snare. At 1235 I delivered a viable female infant via OA, presentation. Nuchal cord x1, loose, reduced, body cord x1, reduced. Infant placed on maternal abdomen. Dr. Rana Snare entered room and assumed care.  Donette Larry, CNM 08/25/2017 1:31 PM

## 2017-04-09 DIAGNOSIS — Z363 Encounter for antenatal screening for malformations: Secondary | ICD-10-CM | POA: Diagnosis not present

## 2017-04-09 DIAGNOSIS — Z3A18 18 weeks gestation of pregnancy: Secondary | ICD-10-CM | POA: Diagnosis not present

## 2017-04-24 ENCOUNTER — Encounter: Payer: BC Managed Care – PPO | Admitting: Hematology

## 2017-05-21 ENCOUNTER — Other Ambulatory Visit: Payer: Self-pay

## 2017-05-21 ENCOUNTER — Encounter: Payer: Self-pay | Admitting: Emergency Medicine

## 2017-05-21 ENCOUNTER — Ambulatory Visit: Payer: BLUE CROSS/BLUE SHIELD | Admitting: Emergency Medicine

## 2017-05-21 ENCOUNTER — Ambulatory Visit: Payer: Self-pay

## 2017-05-21 VITALS — BP 110/56 | HR 126 | Temp 99.5°F | Resp 16 | Ht 67.0 in | Wt 196.2 lb

## 2017-05-21 DIAGNOSIS — Z3A24 24 weeks gestation of pregnancy: Secondary | ICD-10-CM

## 2017-05-21 DIAGNOSIS — J22 Unspecified acute lower respiratory infection: Secondary | ICD-10-CM

## 2017-05-21 DIAGNOSIS — R509 Fever, unspecified: Secondary | ICD-10-CM | POA: Insufficient documentation

## 2017-05-21 DIAGNOSIS — Z8669 Personal history of other diseases of the nervous system and sense organs: Secondary | ICD-10-CM | POA: Diagnosis not present

## 2017-05-21 DIAGNOSIS — R6889 Other general symptoms and signs: Secondary | ICD-10-CM | POA: Diagnosis not present

## 2017-05-21 DIAGNOSIS — G35 Multiple sclerosis: Secondary | ICD-10-CM | POA: Insufficient documentation

## 2017-05-21 LAB — POCT CBC
Granulocyte percent: 85.4 %G — AB (ref 37–80)
HCT, POC: 27.3 % — AB (ref 37.7–47.9)
Hemoglobin: 8.9 g/dL — AB (ref 12.2–16.2)
Lymph, poc: 0.9 (ref 0.6–3.4)
MCH, POC: 29.6 pg (ref 27–31.2)
MCHC: 32.7 g/dL (ref 31.8–35.4)
MCV: 90.6 fL (ref 80–97)
MID (CBC): 1 — AB (ref 0–0.9)
MPV: 7.4 fL (ref 0–99.8)
PLATELET COUNT, POC: 286 10*3/uL (ref 142–424)
POC Granulocyte: 10.8 — AB (ref 2–6.9)
POC LYMPH %: 6.9 % — AB (ref 10–50)
POC MID %: 7.7 %M (ref 0–12)
RBC: 3.02 M/uL — AB (ref 4.04–5.48)
RDW, POC: 13.7 %
WBC: 12.6 10*3/uL — AB (ref 4.6–10.2)

## 2017-05-21 LAB — POC INFLUENZA A&B (BINAX/QUICKVUE)
INFLUENZA B, POC: NEGATIVE
Influenza A, POC: NEGATIVE

## 2017-05-21 MED ORDER — AZITHROMYCIN 250 MG PO TABS: ORAL_TABLET | ORAL | 0 refills | Status: DC

## 2017-05-21 NOTE — Telephone Encounter (Signed)
Patient called in and said "when I woke up from my nap, my temperature was up to 102." I asked is she taking tylenol, she said "I did around 12 today." I advised her that with the infection she has, she may have a fever for the next 24-48 hours while taking the antibiotics, continue taking the tylenol every 4-6 hours and her antibiotics. I advised that if she continues to have fevers after 48 hours, to call the office back. I advised if the fever is not going down at all while taking the medication, go to the nearest emergency room to be evaluated. She verbalized understanding.

## 2017-05-21 NOTE — Patient Instructions (Addendum)
     IF you received an x-ray today, you will receive an invoice from [facility]. Please contact [facility] at [phone] with questions or concerns regarding your invoice.   IF you received labwork today, you will receive an invoice from [facility]. Please contact [facility] at [phone] with questions or concerns regarding your invoice.   Our billing staff will not be able to assist you with questions regarding bills from these companies.  You will be contacted with the lab results as soon as they are available. The fastest way to get your results is to activate your My Chart account. Instructions are located on the last page of this paperwork. If you have not heard from us regarding the results in 2 weeks, please contact this office.     Acute Bronchitis, Adult Acute bronchitis is when air tubes (bronchi) in the lungs suddenly get swollen. The condition can make it hard to breathe. It can also cause these symptoms:  A cough.  Coughing up clear, yellow, or green mucus.  Wheezing.  Chest congestion.  Shortness of breath.  A fever.  Body aches.  Chills.  A sore throat.  Follow these instructions at home: Medicines  Take over-the-counter and prescription medicines only as told by your doctor.  If you were prescribed an antibiotic medicine, take it as told by your doctor. Do not stop taking the antibiotic even if you start to feel better. General instructions  Rest.  Drink enough fluids to keep your pee (urine) clear or pale yellow.  Avoid smoking and secondhand smoke. If you smoke and you need help quitting, ask your doctor. Quitting will help your lungs heal faster.  Use an inhaler, cool mist vaporizer, or humidifier as told by your doctor.  Keep all follow-up visits as told by your doctor. This is important. How is this prevented? To lower your risk of getting this condition again:  Wash your hands often with soap and water. If you cannot  use soap and water, use hand sanitizer.  Avoid contact with people who have cold symptoms.  Try not to touch your hands to your mouth, nose, or eyes.  Make sure to get the flu shot every year.  Contact a doctor if:  Your symptoms do not get better in 2 weeks. Get help right away if:  You cough up blood.  You have chest pain.  You have very bad shortness of breath.  You become dehydrated.  You faint (pass out) or keep feeling like you are going to pass out.  You keep throwing up (vomiting).  You have a very bad headache.  Your fever or chills gets worse. This information is not intended to replace advice given to you by your health care provider. Make sure you discuss any questions you have with your health care provider. Document Released: 09/11/2007 Document Revised: 11/01/2015 Document Reviewed: 09/13/2015 Elsevier Interactive Patient Education  2018 [employer]  

## 2017-05-21 NOTE — Progress Notes (Signed)
Kimberly Strickland 27 y.o.   Chief Complaint  Patient presents with  . Fever    per patient this morning 100 degrees  . Generalized Body Aches    HISTORY OF PRESENT ILLNESS: This is a 27 y.o. female with a history of multiple sclerosis.  Has been sick with flulike symptoms for 1 week.  Last night developed chills and cough turned productive.  Patient is 6 months pregnant.  Also complaining of generalized body aches and this morning she had a low-grade fever.  Denies any other significant symptoms.  Multiple people at work sick with the same.  HPI   Prior to Admission medications   Medication Sig Start Date End Date Taking? Authorizing Provider  Acetaminophen (TYLENOL EXTRA STRENGTH PO) Take by mouth.   Yes [provider]  Doxylamine-Pyridoxine (BONJESTA PO) Take 20 mg by mouth daily.   Yes [provider]  azithromycin (ZITHROMAX) 500 MG tablet Take 500 mg by mouth daily. For 3 days, starting 09.20.2017. 12/27/15   [provider]  cholecalciferol (VITAMIN D) 1000 units tablet Take 5,000 Units by mouth daily.    [provider]  ciprofloxacin (CIPRO) 500 MG tablet Take 500 mg by mouth 2 (two) times daily. For 7 days. 12/27/15   [provider]  meclizine (ANTIVERT) 25 MG tablet Take 1 tablet (25 mg total) by mouth 3 (three) times daily as needed for dizziness. Patient not taking: Reported on 03/24/2017 12/28/15   Melene Plan, DO  ondansetron (ZOFRAN ODT) 4 MG disintegrating tablet 4mg  ODT q4 hours prn nausea/vomit Patient not taking: Reported on 03/24/2017 12/28/15   Melene Plan, DO    No Known Allergies  There are no active problems to display for this patient.   Past Medical History:  Diagnosis Date  . MS (multiple sclerosis) (HCC)     No past surgical history on file.  Social History   Socioeconomic History  . Marital status: Single    Spouse name: Not on file  . Number of children: Not on file  . Years of education: Not on file    . Highest education level: Not on file  Social Needs  . Financial resource strain: Not on file  . Food insecurity - worry: Not on file  . Food insecurity - inability: Not on file  . Transportation needs - medical: Not on file  . Transportation needs - non-medical: Not on file  Occupational History  . Not on file  Tobacco Use  . Smoking status: Former Smoker    Types: Cigars    Last attempt to quit: 2014    Years since quitting: 5.1  . Smokeless tobacco: Never Used  Substance and Sexual Activity  . Alcohol use: No    Frequency: Never  . Drug use: No  . Sexual activity: Yes    Birth control/protection: Other-see comments    Comment: Pregnancy  Other Topics Concern  . Not on file  Social History Narrative  . Not on file    No family history on file.   Review of Systems  Constitutional: Positive for chills and fever.  HENT: Positive for congestion. Negative for sore throat.   Eyes: Negative.  Negative for discharge and redness.  Respiratory: Positive for cough and sputum production. Negative for hemoptysis, shortness of breath and wheezing.   Cardiovascular: Negative.  Negative for chest pain, palpitations and leg swelling.  Gastrointestinal: Negative.  Negative for abdominal pain, diarrhea, nausea and vomiting.  Genitourinary: Negative.  Negative for dysuria, frequency and  hematuria.  Musculoskeletal: Positive for myalgias. Negative for back pain, joint pain and neck pain.  Skin: Negative for rash.  Neurological: Negative.  Negative for dizziness and headaches.  Endo/Heme/Allergies: Negative.   All other systems reviewed and are negative.   Vitals:   05/21/17 1217  BP: (!) 110/56  Pulse: (!) 126  Resp: 16  Temp: 99.5 F (37.5 C)  SpO2: 97%    Physical Exam  Constitutional: She is oriented to person, place, and time. She appears well-developed and well-nourished.  HENT:  Head: Normocephalic and atraumatic.  Nose: Nose normal.  Mouth/Throat: Oropharynx is  clear and moist.  Eyes: Conjunctivae and EOM are normal. Pupils are equal, round, and reactive to light.  Neck: Normal range of motion. Neck supple. No JVD present. No thyromegaly present.  Cardiovascular: Normal rate, regular rhythm and normal heart sounds.  Pulmonary/Chest: Effort normal and breath sounds normal.  Abdominal:  Gravid nontender abdomen.  Musculoskeletal: Normal range of motion.  Lymphadenopathy:    She has no cervical adenopathy.  Neurological: She is alert and oriented to person, place, and time. No sensory deficit. She exhibits normal muscle tone.  Skin: Skin is warm and dry. Capillary refill takes less than 2 seconds. No rash noted.  Psychiatric: She has a normal mood and affect. Her behavior is normal.  Vitals reviewed.   Results for orders placed or performed in visit on 05/21/17 (from the past 24 hour(s))  POCT CBC     Status: Abnormal   Collection Time: 05/21/17 12:51 PM  Result Value Ref Range   WBC 12.6 (A) 4.6 - 10.2 K/uL   Lymph, poc 0.9 0.6 - 3.4   POC LYMPH PERCENT 6.9 (A) 10 - 50 %L   MID (cbc) 1.0 (A) 0 - 0.9   POC MID % 7.7 0 - 12 %M   POC Granulocyte 10.8 (A) 2 - 6.9   Granulocyte percent 85.4 (A) 37 - 80 %G   RBC 3.02 (A) 4.04 - 5.48 M/uL   Hemoglobin 8.9 (A) 12.2 - 16.2 g/dL   HCT, POC 16.1 (A) 09.6 - 47.9 %   MCV 90.6 80 - 97 fL   MCH, POC 29.6 27 - 31.2 pg   MCHC 32.7 31.8 - 35.4 g/dL   RDW, POC 04.5 %   Platelet Count, POC 286 142 - 424 K/uL   MPV 7.4 0 - 99.8 fL  POC Influenza A&B(BINAX/QUICKVUE)     Status: None   Collection Time: 05/21/17  1:01 PM  Result Value Ref Range   Influenza A, POC Negative Negative   Influenza B, POC Negative Negative    ASSESSMENT & PLAN:  Kimberly Strickland was seen today for fever and generalized body aches.  Diagnoses and all orders for this visit:  Lower respiratory infection  Flu-like symptoms -     POCT CBC -     POC Influenza A&B(BINAX/QUICKVUE)  Fever, unspecified fever cause -     POCT  CBC  History of multiple sclerosis  [redacted] weeks gestation of pregnancy  Other orders -     azithromycin (ZITHROMAX) 250 MG tablet; Sig as indicated    Patient Instructions       IF you received an x-ray today, you will receive an invoice from [facility]. Please contact [facility] at [phone] with questions or concerns regarding your invoice.   IF you received labwork today, you will receive an invoice from [place]. Please contact [facility] at [phone] with questions or concerns regarding your invoice.  Our billing staff will not be able to assist you with questions regarding bills from these companies.  You will be contacted with the lab results as soon as they are available. The fastest way to get your results is to activate your My Chart account. Instructions are located on the last page of this paperwork. If you have not heard from [place] regarding the results in 2 weeks, please contact this office.     Acute Bronchitis, Adult Acute bronchitis is when air tubes (bronchi) in the lungs suddenly get swollen. The condition can make it hard to breathe. It can also cause these symptoms:  A cough.  Coughing up clear, yellow, or green mucus.  Wheezing.  Chest congestion.  Shortness of breath.  A fever.  Body aches.  Chills.  A sore throat.  Follow these instructions at home: Medicines  Take over-the-counter and prescription medicines only as told by your doctor.  If you were prescribed an antibiotic medicine, take it as told by your doctor. Do not stop taking the antibiotic even if you start to feel better. General instructions  Rest.  Drink enough fluids to keep your pee (urine) clear or pale yellow.  Avoid smoking and secondhand smoke. If you smoke and you need help quitting, ask your doctor. Quitting will help your lungs heal faster.  Use an inhaler, cool mist vaporizer, or humidifier as told by your doctor.  Keep all follow-up  visits as told by your doctor. This is important. How is this prevented? To lower your risk of getting this condition again:  Wash your hands often with soap and water. If you cannot use soap and water, use hand sanitizer.  Avoid contact with people who have cold symptoms.  Try not to touch your hands to your mouth, nose, or eyes.  Make sure to get the flu shot every year.  Contact a doctor if:  Your symptoms do not get better in 2 weeks. Get help right away if:  You cough up blood.  You have chest pain.  You have very bad shortness of breath.  You become dehydrated.  You faint (pass out) or keep feeling like you are going to pass out.  You keep throwing up (vomiting).  You have a very bad headache.  Your fever or chills gets worse. This information is not intended to replace advice given to you by your health care provider. Make sure you discuss any questions you have with your health care provider. Document Released: 09/11/2007 Document Revised: 11/01/2015 Document Reviewed: 09/13/2015 Elsevier Interactive Patient Education  2018 Elsevier Inc.     Edwina Barth, MD [facility]

## 2017-05-23 ENCOUNTER — Telehealth: Payer: Self-pay | Admitting: Emergency Medicine

## 2017-05-23 NOTE — Telephone Encounter (Signed)
Ok to write note for this patient?

## 2017-05-23 NOTE — Telephone Encounter (Signed)
Copied from CRM [id number]. Topic: Inquiry >> May 23, 2017  1:58 PM Maia Petties wrote: Reason for CRM: pt is requesting work note writing her out 2/13-2/15. She states MD advised her to stay out of work for a few days. Pt is requesting that it be sent to mychart (I have sent her a link to sign up).

## 2017-05-23 NOTE — Telephone Encounter (Signed)
Yes

## 2017-06-03 DIAGNOSIS — Z862 Personal history of diseases of the blood and blood-forming organs and certain disorders involving the immune mechanism: Secondary | ICD-10-CM | POA: Diagnosis not present

## 2017-06-03 DIAGNOSIS — Z3480 Encounter for supervision of other normal pregnancy, unspecified trimester: Secondary | ICD-10-CM | POA: Diagnosis not present

## 2017-06-03 DIAGNOSIS — Z23 Encounter for immunization: Secondary | ICD-10-CM | POA: Diagnosis not present

## 2017-06-23 ENCOUNTER — Encounter: Payer: Self-pay | Admitting: *Deleted

## 2017-06-23 NOTE — Progress Notes (Signed)
Received fax from outside facility requesting advice on how to proceed regarding pt's Hgb levels.  Pt [redacted] weeks pregnant.  Fax placed on Dr. Clyda Greener desk, will wait for instructions to proceed.

## 2017-06-25 ENCOUNTER — Telehealth: Payer: Self-pay | Admitting: Hematology

## 2017-06-25 NOTE — Telephone Encounter (Signed)
   Labs were drawn with ObGyn team - patient did not have lab drawn as requested previously in our clinic. Based on available labs  FVIII levels and Von Willebrand panel show no evidence of Hemophilia A or overt Von Willebrand Disease. No discernible bleeding risk related to this. If increased bleeding issues would need to rpt VWD panel 2 months post partum to get baseline levels (levels during pregnancy would be higher than baseline). HGb 8.9 - likely Iron deficiency related to pregnant. -f/u and Mx per Ob-Gyn. Check ferritin/Iron profile. If iron deficient - recommend Iron polysaccharide 150mg  po BID to maintain ferritin >= 50. Cont prenatal vitamins. Re-consult if hgb dropping after 4 weeks of compliant po iron use.  Johney Maine  MD MS

## 2017-07-22 DIAGNOSIS — Z3A33 33 weeks gestation of pregnancy: Secondary | ICD-10-CM | POA: Diagnosis not present

## 2017-07-22 DIAGNOSIS — Z3480 Encounter for supervision of other normal pregnancy, unspecified trimester: Secondary | ICD-10-CM | POA: Diagnosis not present

## 2017-08-12 DIAGNOSIS — Z3480 Encounter for supervision of other normal pregnancy, unspecified trimester: Secondary | ICD-10-CM | POA: Diagnosis not present

## 2017-08-12 DIAGNOSIS — Z3685 Encounter for antenatal screening for Streptococcus B: Secondary | ICD-10-CM | POA: Diagnosis not present

## 2017-08-24 ENCOUNTER — Encounter (HOSPITAL_COMMUNITY): Payer: Self-pay

## 2017-08-24 ENCOUNTER — Inpatient Hospital Stay (HOSPITAL_COMMUNITY)
Admission: AD | Admit: 2017-08-24 | Discharge: 2017-08-27 | DRG: 807 | Disposition: A | Discharge status: Home / Self Care | Payer: BLUE CROSS/BLUE SHIELD | Source: Ambulatory Visit | Attending: Obstetrics and Gynecology | Admitting: Obstetrics and Gynecology

## 2017-08-24 ENCOUNTER — Other Ambulatory Visit: Payer: Self-pay

## 2017-08-24 DIAGNOSIS — Z820 Family history of epilepsy and other diseases of the nervous system: Secondary | ICD-10-CM | POA: Diagnosis not present

## 2017-08-24 DIAGNOSIS — Z3A38 38 weeks gestation of pregnancy: Secondary | ICD-10-CM | POA: Diagnosis not present

## 2017-08-24 DIAGNOSIS — D2261 Melanocytic nevi of right upper limb, including shoulder: Secondary | ICD-10-CM | POA: Diagnosis not present

## 2017-08-24 DIAGNOSIS — Z3490 Encounter for supervision of normal pregnancy, unspecified, unspecified trimester: Secondary | ICD-10-CM

## 2017-08-24 DIAGNOSIS — Z2882 Immunization not carried out because of caregiver refusal: Secondary | ICD-10-CM | POA: Diagnosis not present

## 2017-08-24 DIAGNOSIS — G35 Multiple sclerosis: Secondary | ICD-10-CM | POA: Diagnosis present

## 2017-08-24 DIAGNOSIS — D2361 Other benign neoplasm of skin of right upper limb, including shoulder: Secondary | ICD-10-CM | POA: Diagnosis not present

## 2017-08-24 DIAGNOSIS — O4292 Full-term premature rupture of membranes, unspecified as to length of time between rupture and onset of labor: Principal | ICD-10-CM | POA: Diagnosis present

## 2017-08-24 DIAGNOSIS — Z87891 Personal history of nicotine dependence: Secondary | ICD-10-CM | POA: Diagnosis not present

## 2017-08-24 DIAGNOSIS — L816 Other disorders of diminished melanin formation: Secondary | ICD-10-CM | POA: Diagnosis not present

## 2017-08-24 HISTORY — DX: Anemia, unspecified: D64.9

## 2017-08-24 HISTORY — DX: Unspecified abnormal cytological findings in specimens from vagina: R87.629

## 2017-08-24 LAB — CBC
HEMATOCRIT: 27.8 % — AB (ref 36.0–46.0)
Hemoglobin: 8.5 g/dL — ABNORMAL LOW (ref 12.0–15.0)
MCH: 26.6 pg (ref 26.0–34.0)
MCHC: 30.6 g/dL (ref 30.0–36.0)
MCV: 86.9 fL (ref 78.0–100.0)
PLATELETS: 256 10*3/uL (ref 150–400)
RBC: 3.2 MIL/uL — ABNORMAL LOW (ref 3.87–5.11)
RDW: 15.5 % (ref 11.5–15.5)
WBC: 10.9 10*3/uL — ABNORMAL HIGH (ref 4.0–10.5)

## 2017-08-24 LAB — OB RESULTS CONSOLE GBS: GBS: NEGATIVE

## 2017-08-24 LAB — TYPE AND SCREEN
ABO/RH(D): O POS
Antibody Screen: NEGATIVE

## 2017-08-24 LAB — ABO/RH: ABO/RH(D): O POS

## 2017-08-24 LAB — POCT FERN TEST: POCT FERN TEST: POSITIVE

## 2017-08-24 MED ORDER — LACTATED RINGERS IV SOLN
INTRAVENOUS | Status: DC
  Administered 2017-08-24: 17:00:00 via INTRAVENOUS

## 2017-08-24 MED ORDER — OXYCODONE-ACETAMINOPHEN 5-325 MG PO TABS: 2.0000 | ORAL_TABLET | ORAL | Status: DC | PRN

## 2017-08-24 MED ORDER — OXYTOCIN BOLUS FROM INFUSION: 500.0000 mL | Freq: Once | INTRAVENOUS | Status: DC

## 2017-08-24 MED ORDER — LACTATED RINGERS IV SOLN: 500.0000 mL | INTRAVENOUS | Status: DC | PRN

## 2017-08-24 MED ORDER — OXYCODONE-ACETAMINOPHEN 5-325 MG PO TABS: 1.0000 | ORAL_TABLET | ORAL | Status: DC | PRN

## 2017-08-24 MED ORDER — SOD CITRATE-CITRIC ACID 500-334 MG/5ML PO SOLN: 30.0000 mL | ORAL | Status: DC | PRN

## 2017-08-24 MED ORDER — FLEET ENEMA 7-19 GM/118ML RE ENEM: 1.0000 | ENEMA | RECTAL | Status: DC | PRN

## 2017-08-24 MED ORDER — LIDOCAINE HCL (PF) 1 % IJ SOLN
30.0000 mL | INTRAMUSCULAR | Status: DC | PRN
  Filled 2017-08-24: qty 30

## 2017-08-24 MED ORDER — ACETAMINOPHEN 325 MG PO TABS: 650.0000 mg | ORAL_TABLET | ORAL | Status: DC | PRN

## 2017-08-24 MED ORDER — ONDANSETRON HCL 4 MG/2ML IJ SOLN: 4.0000 mg | Freq: Four times a day (QID) | INTRAMUSCULAR | Status: DC | PRN

## 2017-08-24 MED ORDER — OXYTOCIN 40 UNITS IN LACTATED RINGERS INFUSION - SIMPLE MED: 2.5000 [IU]/h | INTRAVENOUS | Status: DC

## 2017-08-24 NOTE — H&P (Signed)
Denetria Fillyaw is a 27 y.o. female presenting for SROM. OB History    Gravida  1   Para      Term      Preterm      AB      Living        SAB      TAB      Ectopic      Multiple      Live Births             Past Medical History:  Diagnosis Date  . Anemia    with pregnancy  . MS (multiple sclerosis) (HCC)   . Vaginal Pap smear, abnormal    Past Surgical History:  Procedure Laterality Date  . NO PAST SURGERIES     Family History: family history is not on file. Social History:  reports that she quit smoking about 5 years ago. Her smoking use included cigars. She has never used smokeless tobacco. She reports that she drinks alcohol. She reports that she has current or past drug history. Drug: Marijuana. Frequency: 1.00 time per week.     Maternal Diabetes: No Genetic Screening: Normal Maternal Ultrasounds/Referrals: Normal Fetal Ultrasounds or other Referrals:  None Maternal Substance Abuse:  No Significant Maternal Medications:  None Significant Maternal Lab Results:  None Other Comments:  None  ROS History Dilation: 4 Effacement (%): 50 Station: -3 Exam by:: Gwendolyn Grant, RN  Blood pressure 121/75, pulse 83, temperature 97.8 F (36.6 C), temperature source Axillary, resp. rate 18, height 5\' 8"  (1.727 m), weight 213 lb 8 oz (96.8 kg), last menstrual period 12/13/2016. Exam Physical Exam  Constitutional: She is oriented to person, place, and time. She appears well-developed and well-nourished.  HENT:  Head: Normocephalic and atraumatic.  Neck: Normal range of motion. Neck supple.  Cardiovascular: Normal rate and regular rhythm.  Respiratory: Effort normal and breath sounds normal.  GI:  Term FH,FHR 132  Genitourinary:  Genitourinary Comments: 3/vtx, SROM>>>clear + fern  Musculoskeletal: Normal range of motion.  Neurological: She is alert and oriented to person, place, and time.    Prenatal labs: ABO, Rh: --/--/O POS (05/19 1706) Antibody: NEG  (05/19 1706) Rubella: Immune (10/23 0000) RPR: Nonreactive (10/23 0000)  HBsAg: Negative (10/23 0000)  HIV: Non-reactive (10/23 0000)  GBS: Negative (05/19 2004)   Assessment/Plan: Term IUP, SROM, latent labor, NEG GBS, pt currently declines pitocin, will ambulate and recheck @ [facility]   Kalyan Barabas M Shaquil Aldana 08/24/2017, 10:43 PM

## 2017-08-24 NOTE — Progress Notes (Signed)
GBS negative per Dr. Marcelle Overlie. Patient requests no pitocin at this time. Patient to labor without augmentation and RN to recheck cervix around 2200.

## 2017-08-24 NOTE — Progress Notes (Signed)
Minimal cervical change at 2230. Patient refuses pitocin at this time. Patient to ambulate and RN recheck cervix at 0000 per Dr. Marcelle Overlie.

## 2017-08-24 NOTE — MAU Note (Signed)
Pt states that at approx 1430 she felt a gush of fluid & has continued leaking since then.  States mild lower abdm cramping started at 1400 & has not gotten more frequent or intense since then.  Denies vag bleeding.  States +FM.

## 2017-08-25 ENCOUNTER — Encounter (HOSPITAL_COMMUNITY): Payer: Self-pay | Admitting: *Deleted

## 2017-08-25 LAB — RPR: RPR Ser Ql: NONREACTIVE

## 2017-08-25 MED ORDER — DIBUCAINE 1 % RE OINT: 1.0000 "application " | TOPICAL_OINTMENT | RECTAL | Status: DC | PRN

## 2017-08-25 MED ORDER — OXYTOCIN 40 UNITS IN LACTATED RINGERS INFUSION - SIMPLE MED
1.0000 m[IU]/min | INTRAVENOUS | Status: DC
  Administered 2017-08-25: 500 m[IU]/min via INTRAVENOUS

## 2017-08-25 MED ORDER — WITCH HAZEL-GLYCERIN EX PADS: 1.0000 "application " | MEDICATED_PAD | CUTANEOUS | Status: DC | PRN

## 2017-08-25 MED ORDER — ZOLPIDEM TARTRATE 5 MG PO TABS: 5.0000 mg | ORAL_TABLET | Freq: Every evening | ORAL | Status: DC | PRN

## 2017-08-25 MED ORDER — OXYTOCIN 40 UNITS IN LACTATED RINGERS INFUSION - SIMPLE MED
1.0000 m[IU]/min | INTRAVENOUS | Status: DC
  Administered 2017-08-25: 1 m[IU]/min via INTRAVENOUS
  Filled 2017-08-25: qty 1000

## 2017-08-25 MED ORDER — MEASLES, MUMPS & RUBELLA VAC ~~LOC~~ INJ: 0.5000 mL | INJECTION | Freq: Once | SUBCUTANEOUS | Status: DC

## 2017-08-25 MED ORDER — BENZOCAINE-MENTHOL 20-0.5 % EX AERO
1.0000 "application " | INHALATION_SPRAY | CUTANEOUS | Status: DC | PRN
  Administered 2017-08-26: 1 via TOPICAL
  Filled 2017-08-25: qty 56

## 2017-08-25 MED ORDER — ACETAMINOPHEN 325 MG PO TABS: 650.0000 mg | ORAL_TABLET | ORAL | Status: DC | PRN

## 2017-08-25 MED ORDER — ONDANSETRON HCL 4 MG/2ML IJ SOLN: 4.0000 mg | INTRAMUSCULAR | Status: DC | PRN

## 2017-08-25 MED ORDER — TERBUTALINE SULFATE 1 MG/ML IJ SOLN
0.2500 mg | Freq: Once | INTRAMUSCULAR | Status: DC | PRN
  Filled 2017-08-25: qty 1

## 2017-08-25 MED ORDER — ONDANSETRON HCL 4 MG PO TABS
4.0000 mg | ORAL_TABLET | ORAL | Status: DC | PRN
Start: 2017-08-25 — End: 2017-08-27

## 2017-08-25 MED ORDER — MEDROXYPROGESTERONE ACETATE 150 MG/ML IM SUSP: 150.0000 mg | INTRAMUSCULAR | Status: DC | PRN

## 2017-08-25 MED ORDER — SIMETHICONE 80 MG PO CHEW: 80.0000 mg | CHEWABLE_TABLET | ORAL | Status: DC | PRN

## 2017-08-25 MED ORDER — OXYCODONE-ACETAMINOPHEN 5-325 MG PO TABS: 2.0000 | ORAL_TABLET | ORAL | Status: DC | PRN

## 2017-08-25 MED ORDER — PRENATAL MULTIVITAMIN CH
1.0000 | ORAL_TABLET | Freq: Every day | ORAL | Status: DC
  Administered 2017-08-26 – 2017-08-27 (×2): 1 via ORAL
  Filled 2017-08-25 (×2): qty 1

## 2017-08-25 MED ORDER — COCONUT OIL OIL
1.0000 "application " | TOPICAL_OIL | Status: DC | PRN
  Administered 2017-08-25: 1 via TOPICAL
  Filled 2017-08-25: qty 120

## 2017-08-25 MED ORDER — SENNOSIDES-DOCUSATE SODIUM 8.6-50 MG PO TABS
2.0000 | ORAL_TABLET | ORAL | Status: DC
  Administered 2017-08-25 – 2017-08-26 (×2): 2 via ORAL
  Filled 2017-08-25 (×2): qty 2

## 2017-08-25 MED ORDER — OXYCODONE-ACETAMINOPHEN 5-325 MG PO TABS: 1.0000 | ORAL_TABLET | ORAL | Status: DC | PRN

## 2017-08-25 MED ORDER — TETANUS-DIPHTH-ACELL PERTUSSIS 5-2.5-18.5 LF-MCG/0.5 IM SUSP: 0.5000 mL | Freq: Once | INTRAMUSCULAR | Status: DC

## 2017-08-25 MED ORDER — IBUPROFEN 600 MG PO TABS
600.0000 mg | ORAL_TABLET | Freq: Four times a day (QID) | ORAL | Status: DC
  Administered 2017-08-25 – 2017-08-27 (×9): 600 mg via ORAL
  Filled 2017-08-25 (×8): qty 1

## 2017-08-25 MED ORDER — DIPHENHYDRAMINE HCL 25 MG PO CAPS: 25.0000 mg | ORAL_CAPSULE | Freq: Four times a day (QID) | ORAL | Status: DC | PRN

## 2017-08-25 NOTE — Lactation Note (Signed)
This note was copied from a baby's chart. Lactation Consultation Note  Patient Name: Kimberly Strickland Today's Date: 08/25/2017 Reason for consult: Initial assessment;1st time breastfeeding;Primapara;Early term 37-38.6wks  P1 mother whose infant is now 66 hours old.    Infant sleeping and breastfed an hour ago.  Mother states breastfeeding is going well and she has no questions/concerns.    I encouraged feeding 8-12 times/24 hours or earlier if baby shows feeding cues.  Reviewed feeding cues.  Continue to do STS and to watch for feeding cues.  FOB asking about sleeping positions and reminded parents to place baby always on a firm surface on her back for sleep.  Mom made aware of O/P services, breastfeeding support groups, community resources, and our phone # for post-discharge questions.  Mother will call for assistance as needed.  FOB present and supportive.   Maternal Data Formula Feeding for Exclusion: No Has patient been taught Hand Expression?: Yes Does the patient have breastfeeding experience prior to this delivery?: No  Feeding Feeding Type: Breast Fed Length of feed: 30 min  LATCH Score                   Interventions    Lactation Tools Discussed/Used     Consult Status Consult Status: Follow-up Date: 08/26/17 Follow-up type: In-patient    Kimberly Strickland R Ronson Hagins 08/25/2017, 11:24 PM

## 2017-08-25 NOTE — Progress Notes (Signed)
Patient ID: Kimberly Strickland, female   DOB: 12-Jul-1990, 27 y.o.   MRN: [id number] Pt has questions about management of labor (boyfriend and mother have concerns too)  VSSAF FHR 140s + accels  Cx not checked due to PROM  IUP at term PROM - now agreeing to pitocin Discussed pain management options and concerns.  Reviewed birthing plan.  All of their concerns and questions addressed.

## 2017-08-25 NOTE — Anesthesia Pain Management Evaluation Note (Signed)
  CR[facility] Pain Management Visit Note  Patient: Kimberly Strickland, 27 y.o., female  "Hello I am a member of the anesthesia team at [facility]. We have an anesthesia team available at all times to provide care throughout the hospital, including epidural management and anesthesia for C-section. I don't know your plan for the delivery whether it a natural birth, water birth, IV sedation, nitrous supplementation, doula or epidural, but we want to meet your pain goals."   1.Was your pain managed to your expectations on prior hospitalizations?   No   2.What is your expectation for pain management during this hospitalization?     Labor support without medications  3.How can we help you reach that goal? Be available  Record the patient's initial score and the patient's pain goal.   Pain: 1  Pain Goal: 10 [facility] wants you to be able to say your pain was always managed very well.  Edison Pace 08/25/2017

## 2017-08-25 NOTE — Progress Notes (Signed)
Discussed the risks of prolonged rupture of membranes with patient and family. Patient refuses pitocin for labor augmentation. Will continue to monitor for s/s of infection.

## 2017-08-25 NOTE — Progress Notes (Signed)
Minimal cervical change at 0537. Educated patient on benefits of labor augmentation with SROM and risks of prolonged ROM. Patient agrees to start pitocin at this time. Pitocin initiated at 0550.

## 2017-08-26 LAB — CBC
HEMATOCRIT: 23.9 % — AB (ref 36.0–46.0)
HEMOGLOBIN: 7.5 g/dL — AB (ref 12.0–15.0)
MCH: 27.5 pg (ref 26.0–34.0)
MCHC: 31.4 g/dL (ref 30.0–36.0)
MCV: 87.5 fL (ref 78.0–100.0)
Platelets: 220 10*3/uL (ref 150–400)
RBC: 2.73 MIL/uL — AB (ref 3.87–5.11)
RDW: 15.4 % (ref 11.5–15.5)
WBC: 13.4 10*3/uL — AB (ref 4.0–10.5)

## 2017-08-26 NOTE — Progress Notes (Signed)
Patient doing well No complaints.  BP 108/71 (BP Location: Right Arm)   Pulse 78   Temp 98.7 F (37.1 C) (Oral)   Resp 16   Ht 5\' 8"  (1.727 m)   Wt 96.8 kg (213 lb 8 oz)   LMP 12/13/2016 Comment: Periods come late from month to month  Breastfeeding? Unknown   BMI 32.46 kg/m  Results for orders placed or performed during the hospital encounter of 08/24/17 (from the past 24 hour(s))  CBC     Status: Abnormal   Collection Time: 08/26/17  5:40 AM  Result Value Ref Range   WBC 13.4 (H) 4.0 - 10.5 K/uL   RBC 2.73 (L) 3.87 - 5.11 MIL/uL   Hemoglobin 7.5 (L) 12.0 - 15.0 g/dL   HCT 40.9 (L) 73.5 - 32.9 %   MCV 87.5 78.0 - 100.0 fL   MCH 27.5 26.0 - 34.0 pg   MCHC 31.4 30.0 - 36.0 g/dL   RDW 92.4 26.8 - 34.1 %   Platelets 220 150 - 400 K/uL   Abdomen is soft and non tender Lochia wnl  PPD # 1  Doing well Routine care Discharge home tomorrow

## 2017-08-27 MED ORDER — FERROUS SULFATE 325 (65 FE) MG PO TABS: 325.0000 mg | ORAL_TABLET | Freq: Every day | ORAL | 3 refills | Status: DC

## 2017-08-27 MED ORDER — IBUPROFEN 600 MG PO TABS: 600.0000 mg | ORAL_TABLET | Freq: Four times a day (QID) | ORAL | 0 refills | Status: DC | PRN

## 2017-08-27 MED ORDER — ACETAMINOPHEN 325 MG PO TABS: 650.0000 mg | ORAL_TABLET | Freq: Four times a day (QID) | ORAL | 0 refills | Status: DC | PRN

## 2017-08-27 NOTE — Discharge Instructions (Signed)
Call office for an appointment in 1-2 weeks

## 2017-08-27 NOTE — Lactation Note (Signed)
This note was copied from a baby's chart. Lactation Consultation Note  Patient Name: Kimberly Strickland [id number] Date: 08/27/2017 Reason for consult: Follow-up assessment;Early term 37-38.6wks   P1, Baby 23 hours old and mother is excited because she is able to express more. Discussed pumping and going back to work. Baby recently breastfed.  Mother attempted while LC in room but baby was not interested. Mom encouraged to feed baby 8-12 times/24 hours and with feeding cues.  Reviewed engorgement care and monitoring voids/stools. Provided mother w/ manual pump. Mother's nipples are tender.  Suggest she apply ebm or coconut oil.   Maternal Data    Feeding Feeding Type: Breast Fed Length of feed: 17 min  LATCH Score                   Interventions Interventions: Coconut oil;Hand pump  Lactation Tools Discussed/Used     Consult Status Consult Status: Complete    Hardie Pulley 08/27/2017, 9:58 AM

## 2017-08-27 NOTE — Progress Notes (Signed)
Post Partum Day 2 Subjective: up ad lib, voiding, tolerating PO and + flatus  Ambulating well, no dizziness  Objective: Blood pressure 106/62, pulse 82, temperature 98.2 F (36.8 C), temperature source Oral, resp. rate 16, height 5\' 8"  (1.727 m), weight 213 lb 8 oz (96.8 kg), last menstrual period 12/13/2016, SpO2 100 %, unknown if currently breastfeeding.  Physical Exam:  General: alert, cooperative and no distress Lochia: appropriate Uterine Fundus: firm Incision: healing well DVT Evaluation: No evidence of DVT seen on physical exam.  Recent Labs    08/24/17 1706 08/26/17 0540  HGB 8.5* 7.5*  HCT 27.8* 23.9*    Assessment/Plan: Discharge home  FU 1-2 weeks in office   LOS: 3 days   Roselle Locus II 08/27/2017, 8:06 AM

## 2017-08-27 NOTE — Clinical Social Work Maternal (Signed)
CLINICAL SOCIAL WORK MATERNAL/CHILD NOTE  Patient Details  Name: Kimberly Strickland MRN: [id number] Date of Birth: Apr 22, 1990  Date:  08/27/2017  Clinical Social Worker Initiating Note:  Terri Piedra, LCSW Date/Time: Initiated:  08/27/17/0900     Child's Name:  Kimberly Strickland   Biological Parents:  Mother, Father(Milani Cragun and Kaziah Krizek)   Need for Interpreter:  None   Reason for Referral:  Current Substance Use/Substance Use During Pregnancy (THC noted in H&P)   Address:  [place] [address]    Phone number:  [phone] (home)     Additional phone number:   Household Members/Support Persons (HM/SP):   Household Member/Support Person 1   HM/SP Name Relationship DOB or Age  HM/SP -1 Hart Carwin Eisenhart-Hailey FOB/husband    HM/SP -2        HM/SP -3        HM/SP -4        HM/SP -5        HM/SP -6        HM/SP -7        HM/SP -8          Natural Supports (not living in the home):  Friends, Extended Family, Immediate Family(MOB reports having a good support system.)   Professional Supports: None(MOB has had a counselor in the past and is interested in finding a new counselor now.)   Employment:     Type of Work:     Education:      Homebound arranged:    Museum/gallery curator Resources:  Multimedia programmer   Other Resources:      Cultural/Religious Considerations Which May Impact Care: None stated.  Strengths:  Ability to meet basic needs , Home prepared for child    Psychotropic Medications:         Pediatrician:       Pediatrician List:   [facility]      Pediatrician Fax Number:    Risk Factors/Current Problems:  Mental Health Concerns (Upon chart review, CSW notes a hx of Anxiety.  MOB reports no THC use in pregnany.)   Cognitive State:  Able to Concentrate , Alert , Insightful , Linear Thinking , Goal Oriented     Mood/Affect:  Euthymic , Interested , Calm    CSW Assessment: CSW met with MOB in her first floor room/122 to offer support and complete assessment due to marijuana use.  Usage was not noted in [facility], but was documented in H&P.  CSW unaware from chart as to when use was.  CSW noted in PNR that MOB has a hx of Anxiety.  Upon arrival to MOB's room, FOB was asleep on the couch and MOB was settling sleeping baby in bassinet.  CSW explained role and asked if this was a good time to meet with her, understanding that discharge is planned for today.  She said yes.  CSW asked if we could talk about anything in front of FOB if he were to awaken while CSW is in the room.  She replied, "he's a turd."  CSW asked if she would rather step out of the room for privacy.  She said, "yes, but he's going to find out at some point."  CSW took MOB to a consult room to speak with her.  MOB was very pleasant and easy to engage. MOB informed CSW  that "turd" was used as a "term of endearment" and that her husband is "great."  She states that they are newlyweds, married in February.  She spoke about the frustrations and difficulties of this transition, although mostly it has been happy and wonderful.  She states it has been understandably hard also.  CSW validated and normalized her statements and feelings and acknowledged that becoming parents is a beautifully difficult transition in life as well.  MOB reports that most of her anxieties lately have been related to adjusting to married life as well as having MS.  She reports that she has positive coping mechanisms, such as pausing to take time to address her feelings and being quiet before saying something she doesn't mean.  She states she has had therapy in the past and is interested in restarting.  She states she looked up her old therapist, but found that he passed away a few years ago.  She was receptive to information on how to locate a new therapist.  She was engaged and  attentive to information given by CSW regarding PMADs vs Baby Blues and the importance of monitoring her emotions during the postpartum period and notifying a medical professional if concerns arise.  MOB states she has no trouble seeking mental health treatment if needed, and seemed appreciative of the conversation and information shared by CSW. CSW inquired about marijuana use noted in her chart.  She reports that she had no use in pregnancy.  CSW asked what she meant by "he's going to find out at some point," and asked if there was anything else MOB felt she would like to discuss with CSW.  MOB told CSW that she is open with FOB and that although she stepped out to speak with CSW, she would tell him about the conversation.  She declined need to discuss anything further and states no questions, concerns, or needs at this time.  Baby's UDS is negative.  CSW Plan/Description:  No Further Intervention Required/No Barriers to Discharge, Sudden Infant Death Syndrome (SIDS) Education, Other Information/Referral to [employer], Perinatal Mood and Anxiety Disorder (PMADs) Education, CSW Will Continue to Monitor Umbilical Cord Tissue Drug Screen Results and Make Report if Waldron Session, Honor 08/27/2017, 11:08 AM

## 2017-08-27 NOTE — Discharge Summary (Signed)
Obstetric Discharge Summary Reason for Admission: rupture of membranes Prenatal Procedures: none Intrapartum Procedures: spontaneous vaginal delivery Postpartum Procedures: none Complications-Operative and Postpartum: none Hemoglobin  Date Value Ref Range Status  08/26/2017 7.5 (L) 12.0 - 15.0 g/dL Final   HCT  Date Value Ref Range Status  08/26/2017 23.9 (L) 36.0 - 46.0 % Final    Physical Exam:  General: alert, cooperative and no distress Lochia: appropriate Uterine Fundus: firm Incision: healing well DVT Evaluation: No evidence of DVT seen on physical exam.  Discharge Diagnoses: Term Pregnancy-delivered  Discharge Information: Date: 08/27/2017 Activity: pelvic rest Diet: routine Medications: PNV, Ibuprofen and Iron Condition: stable Instructions: refer to practice specific booklet Discharge to: home   Newborn Data: Live born female  Birth Weight: 6 lb 10 oz (3005 g) APGAR: 9, 9  Newborn Delivery   Birth date/time:  08/25/2017 12:35:00 Delivery type:  Vaginal, Spontaneous     Home with mother.  Kimberly Strickland 08/27/2017, 8:10 AM

## 2017-10-02 DIAGNOSIS — Z1389 Encounter for screening for other disorder: Secondary | ICD-10-CM | POA: Diagnosis not present

## 2017-10-28 DIAGNOSIS — J069 Acute upper respiratory infection, unspecified: Secondary | ICD-10-CM | POA: Diagnosis not present

## 2017-11-06 DIAGNOSIS — K429 Umbilical hernia without obstruction or gangrene: Secondary | ICD-10-CM | POA: Diagnosis not present

## 2017-11-06 DIAGNOSIS — Z00121 Encounter for routine child health examination with abnormal findings: Secondary | ICD-10-CM | POA: Diagnosis not present

## 2017-11-06 DIAGNOSIS — Z23 Encounter for immunization: Secondary | ICD-10-CM | POA: Diagnosis not present

## 2017-11-06 DIAGNOSIS — D2361 Other benign neoplasm of skin of right upper limb, including shoulder: Secondary | ICD-10-CM | POA: Diagnosis not present

## 2017-12-09 DIAGNOSIS — Z79899 Other long term (current) drug therapy: Secondary | ICD-10-CM | POA: Diagnosis not present

## 2017-12-09 DIAGNOSIS — G35 Multiple sclerosis: Secondary | ICD-10-CM | POA: Diagnosis not present

## 2017-12-26 DIAGNOSIS — J069 Acute upper respiratory infection, unspecified: Secondary | ICD-10-CM | POA: Diagnosis not present

## 2017-12-26 DIAGNOSIS — R509 Fever, unspecified: Secondary | ICD-10-CM | POA: Diagnosis not present

## 2017-12-30 DIAGNOSIS — J219 Acute bronchiolitis, unspecified: Secondary | ICD-10-CM | POA: Diagnosis not present

## 2018-03-10 DIAGNOSIS — G35 Multiple sclerosis: Secondary | ICD-10-CM | POA: Diagnosis not present

## 2018-03-10 DIAGNOSIS — Z79899 Other long term (current) drug therapy: Secondary | ICD-10-CM | POA: Diagnosis not present

## 2018-03-10 DIAGNOSIS — Z87891 Personal history of nicotine dependence: Secondary | ICD-10-CM | POA: Diagnosis not present

## 2018-03-10 DIAGNOSIS — Z5181 Encounter for therapeutic drug level monitoring: Secondary | ICD-10-CM | POA: Diagnosis not present

## 2018-03-16 DIAGNOSIS — N911 Secondary amenorrhea: Secondary | ICD-10-CM | POA: Diagnosis not present

## 2018-03-22 DIAGNOSIS — G35 Multiple sclerosis: Secondary | ICD-10-CM | POA: Diagnosis not present

## 2018-04-09 DIAGNOSIS — R5383 Other fatigue: Secondary | ICD-10-CM | POA: Diagnosis not present

## 2018-04-09 DIAGNOSIS — Z3480 Encounter for supervision of other normal pregnancy, unspecified trimester: Secondary | ICD-10-CM | POA: Diagnosis not present

## 2018-04-09 DIAGNOSIS — R002 Palpitations: Secondary | ICD-10-CM | POA: Diagnosis not present

## 2018-04-09 DIAGNOSIS — R209 Unspecified disturbances of skin sensation: Secondary | ICD-10-CM | POA: Diagnosis not present

## 2018-04-09 DIAGNOSIS — Z79899 Other long term (current) drug therapy: Secondary | ICD-10-CM | POA: Diagnosis not present

## 2018-04-09 DIAGNOSIS — Z3685 Encounter for antenatal screening for Streptococcus B: Secondary | ICD-10-CM | POA: Diagnosis not present

## 2018-04-09 DIAGNOSIS — G35 Multiple sclerosis: Secondary | ICD-10-CM | POA: Diagnosis not present

## 2018-04-09 DIAGNOSIS — Z3481 Encounter for supervision of other normal pregnancy, first trimester: Secondary | ICD-10-CM | POA: Diagnosis not present

## 2018-04-09 DIAGNOSIS — R269 Unspecified abnormalities of gait and mobility: Secondary | ICD-10-CM | POA: Diagnosis not present

## 2018-04-09 DIAGNOSIS — Z3482 Encounter for supervision of other normal pregnancy, second trimester: Secondary | ICD-10-CM | POA: Diagnosis not present

## 2018-04-13 DIAGNOSIS — R000 Tachycardia, unspecified: Secondary | ICD-10-CM | POA: Diagnosis not present

## 2018-04-13 DIAGNOSIS — G35 Multiple sclerosis: Secondary | ICD-10-CM | POA: Diagnosis not present

## 2018-04-13 DIAGNOSIS — K219 Gastro-esophageal reflux disease without esophagitis: Secondary | ICD-10-CM | POA: Diagnosis not present

## 2018-04-13 DIAGNOSIS — Z0000 Encounter for general adult medical examination without abnormal findings: Secondary | ICD-10-CM | POA: Diagnosis not present

## 2018-04-14 DIAGNOSIS — Z3A12 12 weeks gestation of pregnancy: Secondary | ICD-10-CM | POA: Diagnosis not present

## 2018-04-14 DIAGNOSIS — Z3682 Encounter for antenatal screening for nuchal translucency: Secondary | ICD-10-CM | POA: Diagnosis not present

## 2018-04-15 DIAGNOSIS — Z0000 Encounter for general adult medical examination without abnormal findings: Secondary | ICD-10-CM | POA: Diagnosis not present

## 2018-04-15 DIAGNOSIS — R002 Palpitations: Secondary | ICD-10-CM | POA: Diagnosis not present

## 2018-04-16 DIAGNOSIS — Z331 Pregnant state, incidental: Secondary | ICD-10-CM | POA: Diagnosis not present

## 2018-04-16 DIAGNOSIS — Z124 Encounter for screening for malignant neoplasm of cervix: Secondary | ICD-10-CM | POA: Diagnosis not present

## 2018-04-16 DIAGNOSIS — Z3400 Encounter for supervision of normal first pregnancy, unspecified trimester: Secondary | ICD-10-CM | POA: Diagnosis not present

## 2018-04-16 DIAGNOSIS — Z3481 Encounter for supervision of other normal pregnancy, first trimester: Secondary | ICD-10-CM | POA: Diagnosis not present

## 2018-04-16 DIAGNOSIS — Z113 Encounter for screening for infections with a predominantly sexual mode of transmission: Secondary | ICD-10-CM | POA: Diagnosis not present

## 2018-05-12 DIAGNOSIS — G35 Multiple sclerosis: Secondary | ICD-10-CM | POA: Diagnosis not present

## 2018-05-12 DIAGNOSIS — R262 Difficulty in walking, not elsewhere classified: Secondary | ICD-10-CM | POA: Diagnosis not present

## 2018-05-21 DIAGNOSIS — Z363 Encounter for antenatal screening for malformations: Secondary | ICD-10-CM | POA: Diagnosis not present

## 2018-05-21 DIAGNOSIS — Z3A18 18 weeks gestation of pregnancy: Secondary | ICD-10-CM | POA: Diagnosis not present

## 2018-05-21 DIAGNOSIS — Z3480 Encounter for supervision of other normal pregnancy, unspecified trimester: Secondary | ICD-10-CM | POA: Diagnosis not present

## 2018-05-21 DIAGNOSIS — Z361 Encounter for antenatal screening for raised alphafetoprotein level: Secondary | ICD-10-CM | POA: Diagnosis not present

## 2018-05-22 DIAGNOSIS — Z3482 Encounter for supervision of other normal pregnancy, second trimester: Secondary | ICD-10-CM | POA: Diagnosis not present

## 2018-05-22 DIAGNOSIS — Z3481 Encounter for supervision of other normal pregnancy, first trimester: Secondary | ICD-10-CM | POA: Diagnosis not present

## 2018-05-22 DIAGNOSIS — R002 Palpitations: Secondary | ICD-10-CM | POA: Diagnosis not present

## 2018-05-23 ENCOUNTER — Observation Stay (HOSPITAL_COMMUNITY)
Admission: AD | Admit: 2018-05-23 | Discharge: 2018-05-24 | Disposition: A | Discharge status: Home / Self Care | Payer: BLUE CROSS/BLUE SHIELD | Source: Ambulatory Visit | Attending: Obstetrics and Gynecology | Admitting: Obstetrics and Gynecology

## 2018-05-23 ENCOUNTER — Other Ambulatory Visit: Payer: Self-pay

## 2018-05-23 ENCOUNTER — Encounter (HOSPITAL_COMMUNITY): Payer: Self-pay

## 2018-05-23 ENCOUNTER — Inpatient Hospital Stay (HOSPITAL_COMMUNITY): Payer: BLUE CROSS/BLUE SHIELD

## 2018-05-23 DIAGNOSIS — O26892 Other specified pregnancy related conditions, second trimester: Secondary | ICD-10-CM | POA: Insufficient documentation

## 2018-05-23 DIAGNOSIS — Z791 Long term (current) use of non-steroidal anti-inflammatories (NSAID): Secondary | ICD-10-CM | POA: Insufficient documentation

## 2018-05-23 DIAGNOSIS — G35 Multiple sclerosis: Secondary | ICD-10-CM | POA: Diagnosis not present

## 2018-05-23 DIAGNOSIS — O364XX0 Maternal care for intrauterine death, not applicable or unspecified: Secondary | ICD-10-CM

## 2018-05-23 DIAGNOSIS — R5381 Other malaise: Secondary | ICD-10-CM | POA: Diagnosis not present

## 2018-05-23 DIAGNOSIS — Z3A18 18 weeks gestation of pregnancy: Secondary | ICD-10-CM | POA: Diagnosis not present

## 2018-05-23 DIAGNOSIS — O039 Complete or unspecified spontaneous abortion without complication: Principal | ICD-10-CM | POA: Insufficient documentation

## 2018-05-23 DIAGNOSIS — Z87891 Personal history of nicotine dependence: Secondary | ICD-10-CM | POA: Insufficient documentation

## 2018-05-23 DIAGNOSIS — O3432 Maternal care for cervical incompetence, second trimester: Secondary | ICD-10-CM | POA: Diagnosis not present

## 2018-05-23 DIAGNOSIS — Z79899 Other long term (current) drug therapy: Secondary | ICD-10-CM | POA: Insufficient documentation

## 2018-05-23 DIAGNOSIS — N883 Incompetence of cervix uteri: Secondary | ICD-10-CM | POA: Insufficient documentation

## 2018-05-23 DIAGNOSIS — O021 Missed abortion: Secondary | ICD-10-CM | POA: Diagnosis not present

## 2018-05-23 DIAGNOSIS — O4102X0 Oligohydramnios, second trimester, not applicable or unspecified: Secondary | ICD-10-CM | POA: Insufficient documentation

## 2018-05-23 DIAGNOSIS — Z3A00 Weeks of gestation of pregnancy not specified: Secondary | ICD-10-CM | POA: Diagnosis not present

## 2018-05-23 DIAGNOSIS — O2690 Pregnancy related conditions, unspecified, unspecified trimester: Secondary | ICD-10-CM | POA: Diagnosis not present

## 2018-05-23 LAB — COMPREHENSIVE METABOLIC PANEL
ALBUMIN: 3.1 g/dL — AB (ref 3.5–5.0)
ALK PHOS: 58 U/L (ref 38–126)
ALT: 11 U/L (ref 0–44)
AST: 18 U/L (ref 15–41)
Anion gap: 7 (ref 5–15)
BILIRUBIN TOTAL: 0.3 mg/dL (ref 0.3–1.2)
BUN: 9 mg/dL (ref 6–20)
CALCIUM: 8.8 mg/dL — AB (ref 8.9–10.3)
CO2: 22 mmol/L (ref 22–32)
CREATININE: 0.66 mg/dL (ref 0.44–1.00)
Chloride: 105 mmol/L (ref 98–111)
GFR calc Af Amer: >60 mL/min (ref >60)
GFR calc non Af Amer: >60 mL/min (ref >60)
GLUCOSE: 87 mg/dL (ref 70–99)
Potassium: 3.6 mmol/L (ref 3.5–5.1)
SODIUM: 134 mmol/L — AB (ref 135–145)
Total Protein: 6.2 g/dL — ABNORMAL LOW (ref 6.5–8.1)

## 2018-05-23 LAB — CBC
HCT: 35.4 % — ABNORMAL LOW (ref 36.0–46.0)
Hemoglobin: 11.8 g/dL — ABNORMAL LOW (ref 12.0–15.0)
MCH: 32.5 pg (ref 26.0–34.0)
MCHC: 33.3 g/dL (ref 30.0–36.0)
MCV: 97.5 fL (ref 80.0–100.0)
PLATELETS: 263 10*3/uL (ref 150–400)
RBC: 3.63 MIL/uL — AB (ref 3.87–5.11)
RDW: 12.7 % (ref 11.5–15.5)
WBC: 11.4 10*3/uL — ABNORMAL HIGH (ref 4.0–10.5)
nRBC: 0 % (ref 0.0–0.2)

## 2018-05-23 LAB — TYPE AND SCREEN
ABO/RH(D): O POS
Antibody Screen: NEGATIVE

## 2018-05-23 IMAGING — US US MFM OB LIMITED
1 series · 15 of 28 positions shown · non-contrast
Comparison: none

[Series 1: us mfm ob limited · 44 acquisitions, 15 frames shown]
[im 1/44]
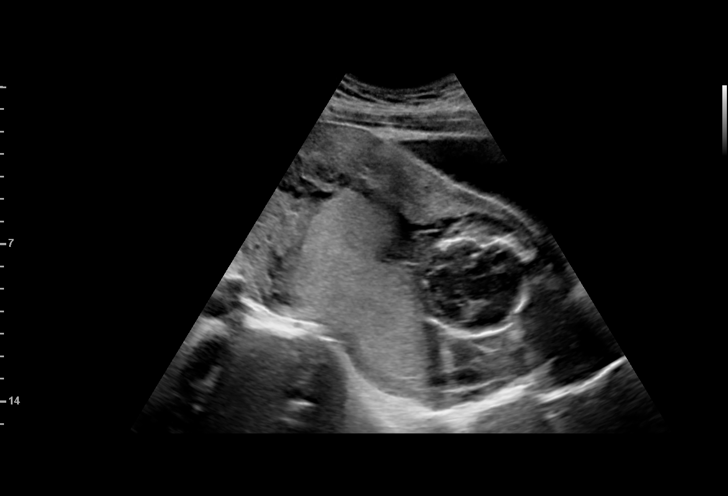
[im 4/44]
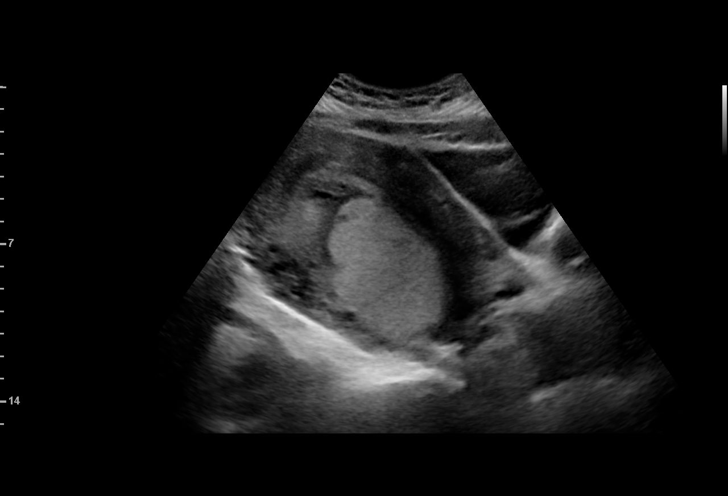
[im 7/44]
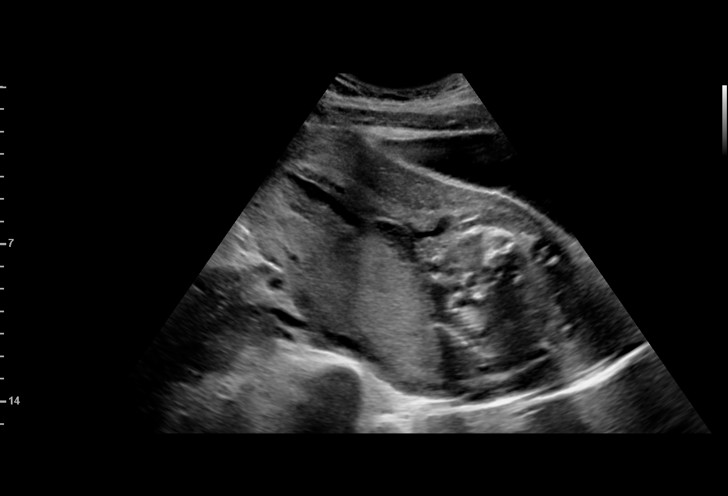
[im 10/44]
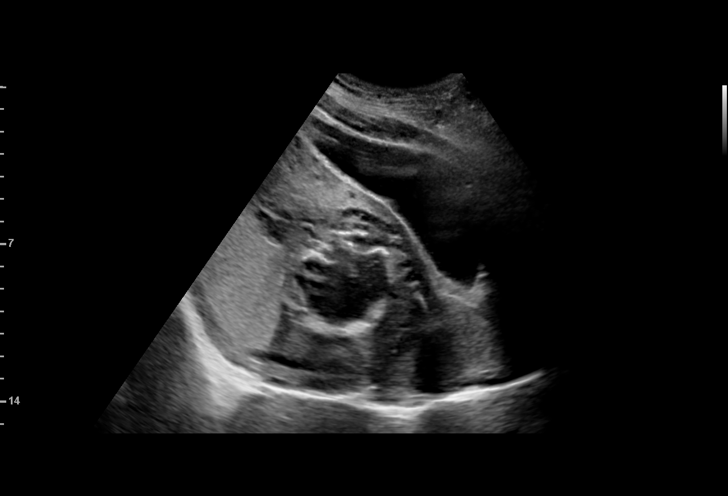
[im 13/44]
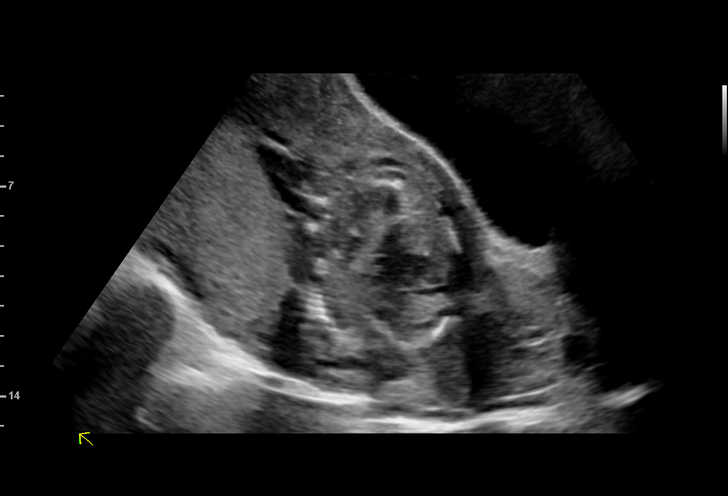
[im 16/44]
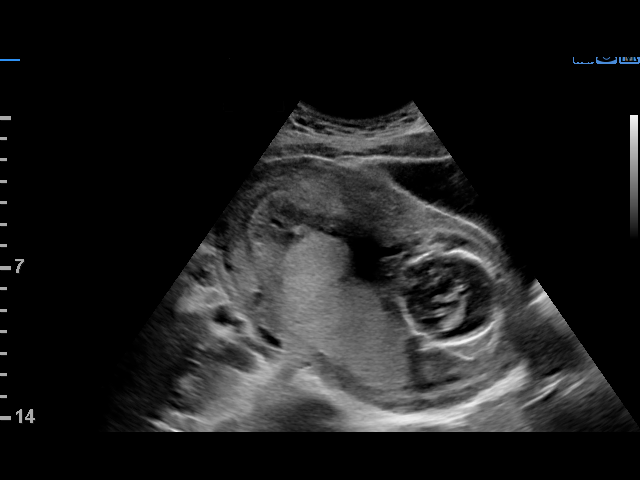
[im 20/44]
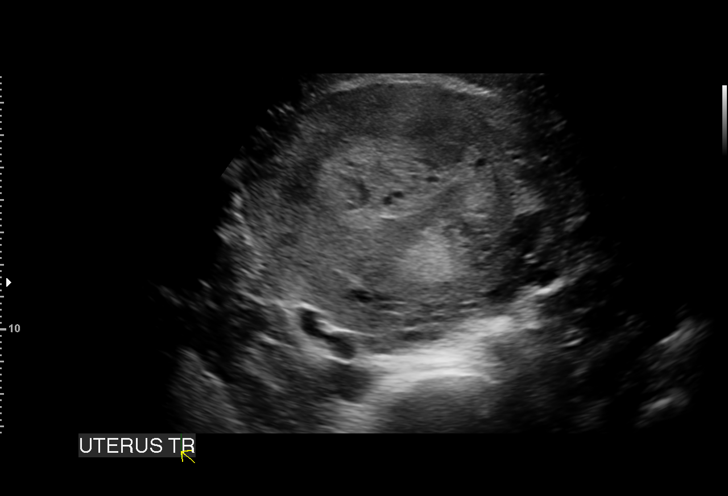
[im 23/44]
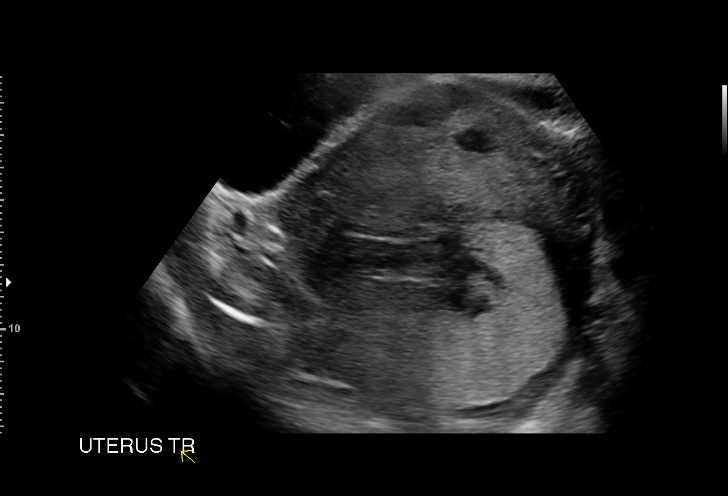
[im 24/44]
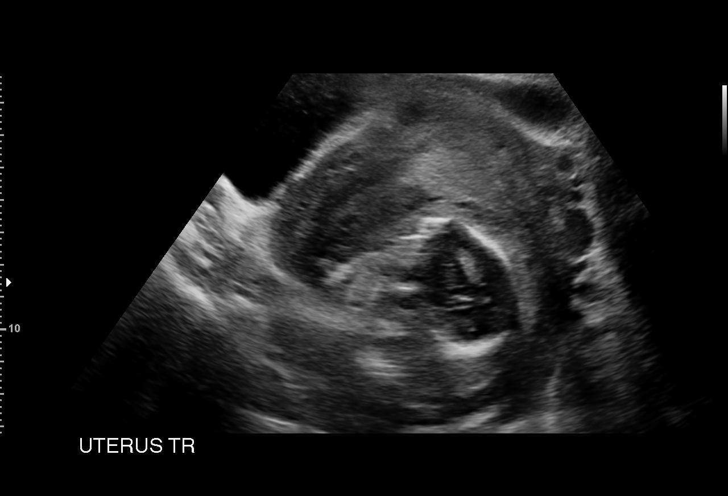
[im 28/44]
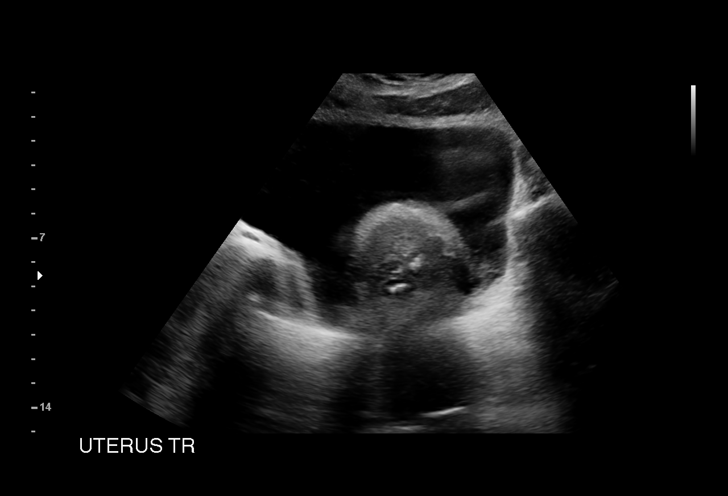
[im 31/44]
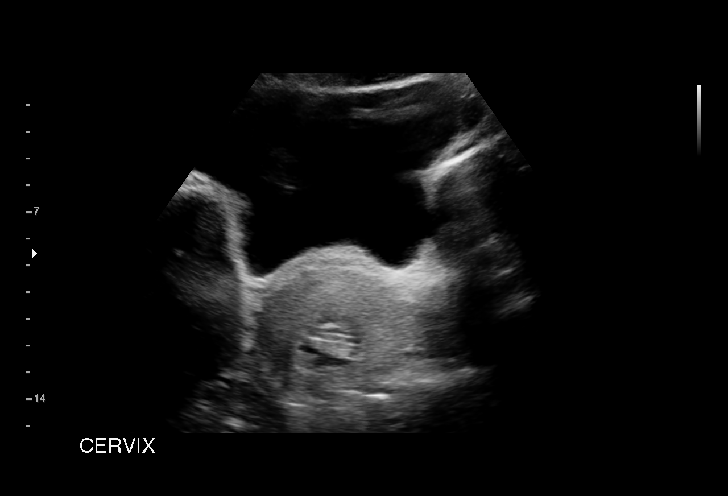
[im 34/44]
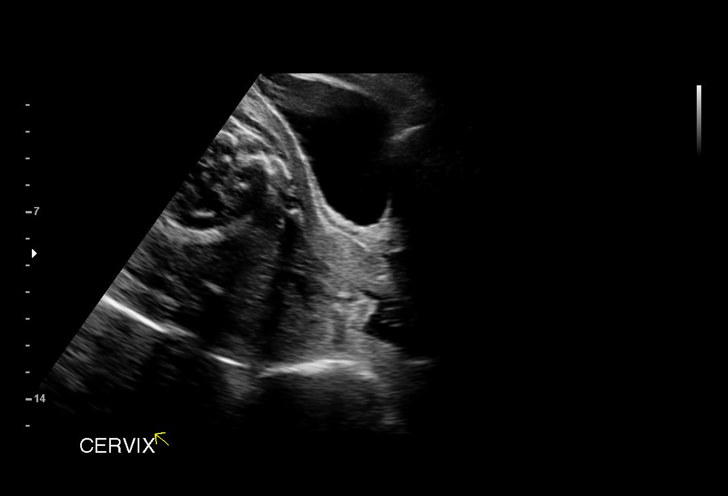
[im 37/44]
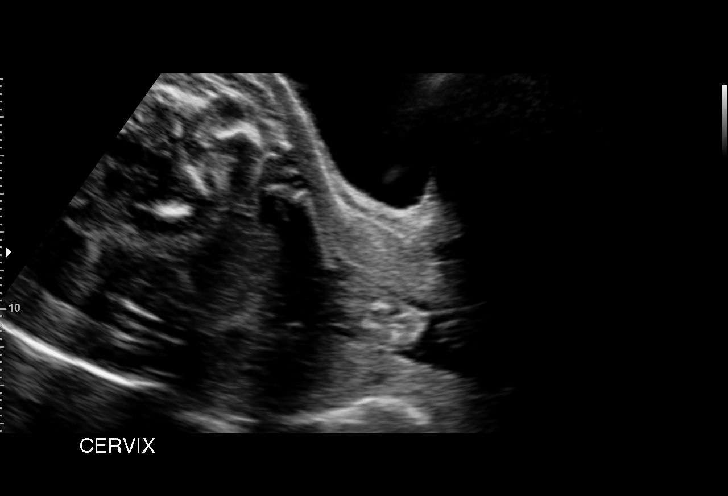
[im 40/44]
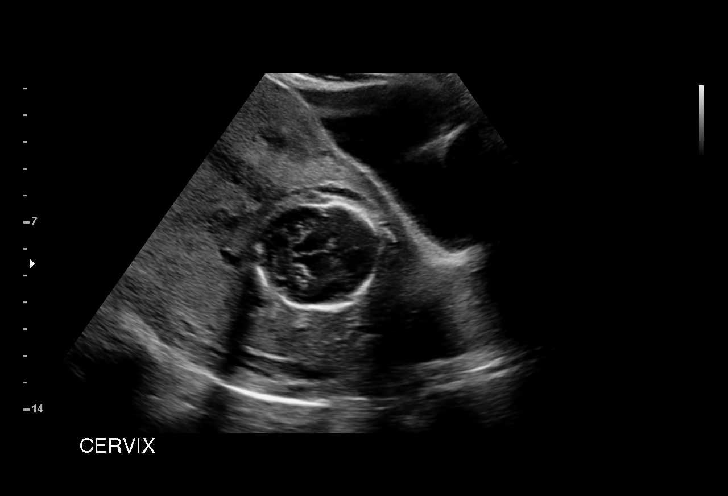
[im 44/44]
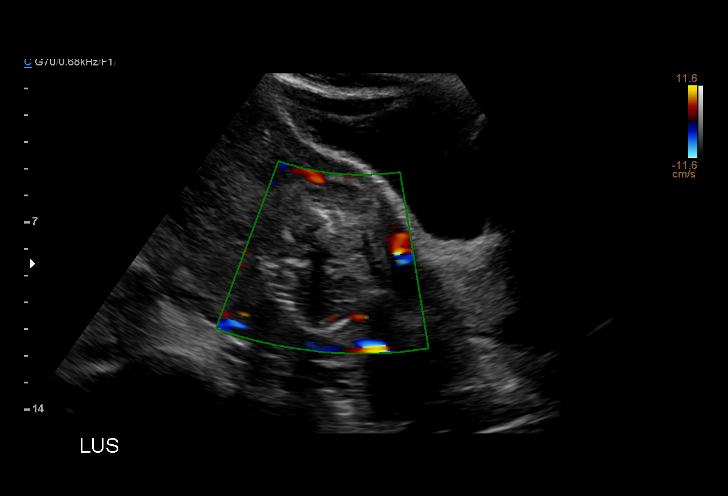

[15 of 28 positions shown; findings below may reference images not displayed]

Attending:        ALAIN DANIEL      Secondary Phy.:    ALAIN DANIEL Nursing-
                                                             MAU/Triage

  1  US MFM OB LIMITED                    76815.01     ALAIN DANIEL
 ----------------------------------------------------------------------

 ----------------------------------------------------------------------
Indications

  18 weeks gestation of pregnancy
  Encounter for assessment of fetal demise       [84]
  Cervical incompetence, second trimester,       [84]
  bulging membrane
 ----------------------------------------------------------------------
Fetal Evaluation

 Num Of Fetuses:          1
 Cardiac Activity:        Absent
 Presentation:            Transverse, head to maternal right
 Placenta:                Posterior

 Amniotic Fluid
 AFI FV:      Oligohydramnios

 Comment:    Bulging of fluid filled membrane present in the vulvar area.
             Head and body visualized in the lower uterin segment in the
             hyperflex position.
Biometry

 BPD:      40.6  mm     G. Age:  18w 2d         52  %
OB History

 Gravidity:    2         Term:   1
 Living:       1
Gestational Age

 LMP:           18w 2d        Date:  [DATE]                 EDD:   [DATE]
 U/S Today:     18w 2d                                        EDD:   [DATE]
 Best:          18w 2d     Det. By:  LMP  ([DATE])          EDD:   [DATE]
Cervix Uterus Adnexa

 Cervix
 Length:           3.22  cm.
 Appears dilated, see comments. Canal AP: 1.68 cm

 Comment
 Head and body visualized in the lower uterin
 segment. Some echogenic body parts
 extending into the cervical canal.
Impression

 IUFD
 Oligohydramnios.
Recommendations

 Clinical correlation recommended.

## 2018-05-23 MED ORDER — ACETAMINOPHEN 325 MG PO TABS: 650.0000 mg | ORAL_TABLET | ORAL | Status: DC | PRN

## 2018-05-23 MED ORDER — LACTATED RINGERS IV SOLN
INTRAVENOUS | Status: DC
  Administered 2018-05-23: 16:00:00 via INTRAVENOUS

## 2018-05-23 MED ORDER — LACTATED RINGERS IV SOLN: 500.0000 mL | INTRAVENOUS | Status: DC | PRN

## 2018-05-23 MED ORDER — METHYLERGONOVINE MALEATE 0.2 MG/ML IJ SOLN
INTRAMUSCULAR | Status: AC
  Administered 2018-05-23: 0.2 mg
  Filled 2018-05-23: qty 1

## 2018-05-23 MED ORDER — SOD CITRATE-CITRIC ACID 500-334 MG/5ML PO SOLN: 30.0000 mL | ORAL | Status: DC | PRN

## 2018-05-23 MED ORDER — OXYCODONE-ACETAMINOPHEN 5-325 MG PO TABS
1.0000 | ORAL_TABLET | ORAL | Status: DC | PRN
  Administered 2018-05-23: 1 via ORAL
  Filled 2018-05-23: qty 1

## 2018-05-23 MED ORDER — BUTORPHANOL TARTRATE 1 MG/ML IJ SOLN: 1.0000 mg | INTRAMUSCULAR | Status: DC | PRN

## 2018-05-23 MED ORDER — FLEET ENEMA 7-19 GM/118ML RE ENEM: 1.0000 | ENEMA | RECTAL | Status: DC | PRN

## 2018-05-23 MED ORDER — HYDROXYZINE HCL 50 MG PO TABS
50.0000 mg | ORAL_TABLET | Freq: Four times a day (QID) | ORAL | Status: DC | PRN
  Filled 2018-05-23: qty 1

## 2018-05-23 MED ORDER — LIDOCAINE HCL (PF) 1 % IJ SOLN
30.0000 mL | INTRAMUSCULAR | Status: DC | PRN
  Filled 2018-05-23: qty 30

## 2018-05-23 MED ORDER — METHYLERGONOVINE MALEATE 0.2 MG/ML IJ SOLN: 0.2000 mg | Freq: Once | INTRAMUSCULAR | Status: DC

## 2018-05-23 MED ORDER — CEFAZOLIN SODIUM-DEXTROSE 1-4 GM/50ML-% IV SOLN
1.0000 g | Freq: Once | INTRAVENOUS | Status: AC
  Administered 2018-05-23: 1 g via INTRAVENOUS
  Filled 2018-05-23: qty 50

## 2018-05-23 MED ORDER — ONDANSETRON HCL 4 MG/2ML IJ SOLN: 4.0000 mg | Freq: Four times a day (QID) | INTRAMUSCULAR | Status: DC | PRN

## 2018-05-23 MED ORDER — LACTATED RINGERS IV SOLN: INTRAVENOUS | Status: DC

## 2018-05-23 MED ORDER — OXYTOCIN 40 UNITS IN NORMAL SALINE INFUSION - SIMPLE MED
2.5000 [IU]/h | INTRAVENOUS | Status: DC
  Administered 2018-05-23: 2.5 [IU]/h via INTRAVENOUS
  Filled 2018-05-23 (×2): qty 1000

## 2018-05-23 MED ORDER — OXYCODONE-ACETAMINOPHEN 5-325 MG PO TABS: 2.0000 | ORAL_TABLET | ORAL | Status: DC | PRN

## 2018-05-23 MED ORDER — OXYTOCIN BOLUS FROM INFUSION
500.0000 mL | Freq: Once | INTRAVENOUS | Status: AC
  Administered 2018-05-23: 500 mL via INTRAVENOUS

## 2018-05-23 NOTE — MAU Provider Note (Signed)
Chief Complaint: bulging membranes   First Provider Initiated Contact with Patient 05/23/18 1605     SUBJECTIVE HPI: Kimberly Strickland is a 28 y.o. G2P1001 at [redacted]w[redacted]d by 18-week ultrasound (per patient) who presents to Maternity Admissions by EMS due to bag of water protruding through her vagina while walking through bookstore today.  Denies abdominal pain or vaginal bleeding.  Reports she had and [facility].  Records not currently available.  Reports uncomplicated pregnancy.  History of uncomplicated term spontaneous vaginal delivery 9 months ago.  No history of cervical incompetence.   Associated signs and symptoms: Negative for abdominal pain, vaginal bleeding, fever.  Past Medical History:  Diagnosis Date  . Anemia    with pregnancy  . MS (multiple sclerosis) (HCC)   . Vaginal Pap smear, abnormal    OB History  Gravida Para Term Preterm AB Living  2 1 1     1   SAB TAB Ectopic Multiple Live Births        0 1    # Outcome Date GA Lbr Len/2nd Weight Sex Delivery Anes PTL Lv  2 Current           1 Term 08/25/17 [redacted]w[redacted]d 22:00 / 00:05 3005 g F Vag-Spont None  LIV   Past Surgical History:  Procedure Laterality Date  . NO PAST SURGERIES     Social History   Socioeconomic History  . Marital status: Married    Spouse name: Not on file  . Number of children: Not on file  . Years of education: Not on file  . Highest education level: Not on file  Occupational History  . Not on file  Social Needs  . Financial resource strain: Not on file  . Food insecurity:    Worry: Not on file    Inability: Not on file  . Transportation needs:    Medical: Not on file    Non-medical: Not on file  Tobacco Use  . Smoking status: Former Smoker    Types: Cigars    Last attempt to quit: 2014    Years since quitting: 6.1  . Smokeless tobacco: Never Used  Substance and Sexual Activity  . Alcohol use: Yes    Frequency: Never    Comment: 1/week  .  Drug use: Yes    Frequency: 1.0 times per week    Types: Marijuana  . Sexual activity: Yes    Birth control/protection: Other-see comments    Comment: Pregnancy  Lifestyle  . Physical activity:    Days per week: Not on file    Minutes per session: Not on file  . Stress: Not on file  Relationships  . Social connections:    Talks on phone: Not on file    Gets together: Not on file    Attends religious service: Not on file    Active member of club or organization: Not on file    Attends meetings of clubs or organizations: Not on file    Relationship status: Not on file  . Intimate partner violence:    Fear of current or ex partner: Not on file    Emotionally abused: Not on file    Physically abused: Not on file    Forced sexual activity: Not on file  Other Topics Concern  . Not on file  Social History Narrative  . Not on file   No family history on file. No current facility-administered medications on file prior to encounter.  Current Outpatient Medications on File Prior to Encounter  Medication Sig Dispense Refill  . acetaminophen (TYLENOL) 325 MG tablet Take 2 tablets (650 mg total) by mouth every 6 (six) hours as needed (for pain scale < 4). 60 tablet 0  . ferrous sulfate (FERROUSUL) 325 (65 FE) MG tablet Take 1 tablet (325 mg total) by mouth daily with breakfast. 30 tablet 3  . ibuprofen (ADVIL,MOTRIN) 600 MG tablet Take 1 tablet (600 mg total) by mouth every 6 (six) hours as needed. 30 tablet 0  . Multiple Vitamins-Minerals (MULTIVITAMIN WITH MINERALS) tablet Take 1 tablet by mouth daily.     No Known Allergies  I have reviewed patient's Past Medical Hx, Surgical Hx, Family Hx, Social Hx, medications and allergies.   Review of Systems  Constitutional: Negative for chills and fever.  Gastrointestinal: Negative for abdominal pain.  Genitourinary: Negative for vaginal bleeding and vaginal discharge.    OBJECTIVE Patient Vitals for the past 24 hrs:  BP Temp Temp src  Pulse Resp SpO2  05/23/18 1605 - 98 F (36.7 C) Oral - - -  05/23/18 1558 101/66 - - 94 20 99 %   Constitutional: Well-developed, well-nourished female in no acute distress.  Cardiovascular: normal rate Respiratory: normal rate and effort.  GI: Abd soft, non-tender, gravid, size less than dates. Neurologic: Alert and oriented x 4.  GU: Neg CVAT.  Large bag of water protruding from vagina.  No visible fetal parts and amniotic fluid.  No blood or leaking of fluid seen.         LAB RESULTS No results found for this or any previous visit (from the past 24 hour(s)).  IMAGING Preliminary ultrasound results show no cardiac activity.  Fetus in the lower uterine segment.  Cervix long, slightly open.  MAU COURSE Orders Placed This Encounter  Procedures  . [place] MFM OB Limited  . CBC  . Comprehensive metabolic panel  . Diet NPO time specified  . Type and screen   Meds ordered this encounter  Medications  . lactated ringers infusion   Called Dr. Vergie Living to notify him of patient history and exam.  Directed to call Dr. Henderson Cloud coming to hospital now to determine and discuss plan of care with patient.  Pt reporting extreme bladder pressure. RN tried to get pt on bedpad to void, but pt was unable to. Ordered I&O cath, but RN unable to access urethral meatus 2/2 BBOW in the way. Pt becoming very anxious and uncomfortable. Dr. Henderson Cloud has not arrived yet.  CNM at [facility]. Informed pt and partner of fetal demise. Pt's bed returned to flat position. CNM abel to I&O cath pt for ~962ml.Cord visible in BOW. Pt feeling much better.  1721: Dr. Henderson Cloud at [facility].    MDM -18.2-week fetal demise with bag of water bulging out of vagina.  ASSESSMENT 1. Fetal demise > 22 weeks, delivered, current hospitalization     PLAN Care of patient turned over to Dr. Henderson Cloud.   Katrinka Blazing, [place], [place] 05/23/2018  5:26 PM

## 2018-05-23 NOTE — Progress Notes (Signed)
Upon arrival on L&D the fetal breech had delivered up to the head, scant bleeding. Pitocin bolus given. Fetus delivered about 6:50pm.  Some Subq hemorrhage over scalp. Otherwise grossly normal, no maceration.   Cord clamped and cut. Fetus swaddled in towel. Very little bleeding noted. Pitocin bolus repeated. After about 25 minutes the placenta delivered. Inspection notes vulva/vagina/cervix intact. Little bleeding. Will give methergine IM x 1 and Ancef IV x 1. Patient wants to go home this pm. Will observe 2-3 hours.

## 2018-05-23 NOTE — H&P (Signed)
Kimberly Strickland is a 28 y.o. female presenting for sudden pelvic pressure today while shopping. Tampon like vaginal pressure. No trauma, no fever, no bleeding. SVD about 9 months ago, uncomplicated, full term, rapid second stage. This pregnancy complicated by MS. OB History    Gravida  2   Para  1   Term  1   Preterm      AB      Living  1     SAB      TAB      Ectopic      Multiple  0   Live Births  1          Past Medical History:  Diagnosis Date  . Anemia    with pregnancy  . MS (multiple sclerosis) (HCC)   . Vaginal Pap smear, abnormal    Past Surgical History:  Procedure Laterality Date  . NO PAST SURGERIES     Family History: family history is not on file. Social History:  reports that she quit smoking about 6 years ago. Her smoking use included cigars. She has never used smokeless tobacco. She reports current alcohol use. She reports current drug use. Frequency: 1.00 time per week. Drug: Marijuana.     Maternal Diabetes: No Genetic Screening: Normal Maternal Ultrasounds/Referrals: Normal Fetal Ultrasounds or other Referrals:  None Maternal Substance Abuse:  No Significant Maternal Medications:  None Significant Maternal Lab Results:  None Other Comments:  None  Review of Systems  Constitutional: Negative for fever.  Eyes: Negative for blurred vision.  Gastrointestinal: Negative for abdominal pain.  Neurological: Negative for headaches.   History   Blood pressure 101/66, pulse 94, temperature 98 F (36.7 C), temperature source Oral, resp. rate 20, SpO2 99 %, unknown if currently breastfeeding. Exam Physical Exam  Cardiovascular: Normal rate and regular rhythm.  Respiratory: Effort normal and breath sounds normal.  GI: Soft. There is no abdominal tenderness.  Neurological: She has normal reflexes.  BOW prolapsed through vagina with clear light yellow fluid and umbilical cord visible Cx 2-3 cm with bag prolapsing through  U/S >no FHT. Formal  report pending.   Prenatal labs: ABO, Rh: --/--/O POS (05/19 1706) Antibody: NEG (05/19 1706) Rubella:   RPR: Non Reactive (05/19 1706)  HBsAg:    HIV:    GBS: Negative (05/19 2004)   Results for orders placed or performed during the hospital encounter of 05/23/18 (from the past 24 hour(s))  CBC     Status: Abnormal   Collection Time: 05/23/18  4:00 PM  Result Value Ref Range   WBC 11.4 (H) 4.0 - 10.5 K/uL   RBC 3.63 (L) 3.87 - 5.11 MIL/uL   Hemoglobin 11.8 (L) 12.0 - 15.0 g/dL   HCT 94.7 (L) 65.4 - 65.0 %   MCV 97.5 80.0 - 100.0 fL   MCH 32.5 26.0 - 34.0 pg   MCHC 33.3 30.0 - 36.0 g/dL   RDW 35.4 65.6 - 81.2 %   Platelets 263 150 - 400 K/uL   nRBC 0.0 0.0 - 0.2 %   Assessment/Plan: 28 yo G2P1 with IUFD and probable cervical insufficiency @ 18 2/7wks No clinical evidence of infection D/W patient misoprostol 400 mcg q4 hours per vagina D/W possible D&E for retained placenta   Roselle Locus II 05/23/2018, 5:42 PM

## 2018-05-23 NOTE — MAU Note (Signed)
Pt arrived via EMS with c/o bulging amniotic bag.  Pt denies pain or vag bleeding, but reports pressure

## 2018-05-24 ENCOUNTER — Other Ambulatory Visit: Payer: Self-pay

## 2018-05-24 LAB — CBC
HCT: 32.2 % — ABNORMAL LOW (ref 36.0–46.0)
Hemoglobin: 10.6 g/dL — ABNORMAL LOW (ref 12.0–15.0)
MCH: 32.1 pg (ref 26.0–34.0)
MCHC: 32.9 g/dL (ref 30.0–36.0)
MCV: 97.6 fL (ref 80.0–100.0)
Platelets: 218 10*3/uL (ref 150–400)
RBC: 3.3 MIL/uL — ABNORMAL LOW (ref 3.87–5.11)
RDW: 12.6 % (ref 11.5–15.5)
WBC: 12.5 10*3/uL — ABNORMAL HIGH (ref 4.0–10.5)
nRBC: 0 % (ref 0.0–0.2)

## 2018-05-24 LAB — RPR: RPR Ser Ql: NONREACTIVE

## 2018-05-24 MED ORDER — SIMETHICONE 80 MG PO CHEW: 80.0000 mg | CHEWABLE_TABLET | ORAL | Status: DC | PRN

## 2018-05-24 MED ORDER — IBUPROFEN 600 MG PO TABS
600.0000 mg | ORAL_TABLET | Freq: Four times a day (QID) | ORAL | Status: DC
  Administered 2018-05-24 (×2): 600 mg via ORAL
  Filled 2018-05-24 (×2): qty 1

## 2018-05-24 MED ORDER — OXYCODONE HCL 5 MG PO TABS: 5.0000 mg | ORAL_TABLET | Freq: Four times a day (QID) | ORAL | 0 refills | Status: DC | PRN

## 2018-05-24 MED ORDER — WITCH HAZEL-GLYCERIN EX PADS: 1.0000 "application " | MEDICATED_PAD | CUTANEOUS | Status: DC | PRN

## 2018-05-24 MED ORDER — PRENATAL MULTIVITAMIN CH: 1.0000 | ORAL_TABLET | Freq: Every day | ORAL | Status: DC

## 2018-05-24 MED ORDER — SENNOSIDES-DOCUSATE SODIUM 8.6-50 MG PO TABS
2.0000 | ORAL_TABLET | ORAL | Status: DC
  Administered 2018-05-24: 2 via ORAL
  Filled 2018-05-24: qty 2

## 2018-05-24 MED ORDER — BENZOCAINE-MENTHOL 20-0.5 % EX AERO: 1.0000 "application " | INHALATION_SPRAY | CUTANEOUS | Status: DC | PRN

## 2018-05-24 MED ORDER — ONDANSETRON HCL 4 MG PO TABS: 4.0000 mg | ORAL_TABLET | ORAL | Status: DC | PRN

## 2018-05-24 MED ORDER — OXYCODONE HCL 5 MG PO TABS: 10.0000 mg | ORAL_TABLET | ORAL | Status: DC | PRN

## 2018-05-24 MED ORDER — DIBUCAINE 1 % RE OINT: 1.0000 "application " | TOPICAL_OINTMENT | RECTAL | Status: DC | PRN

## 2018-05-24 MED ORDER — ONDANSETRON HCL 4 MG/2ML IJ SOLN: 4.0000 mg | INTRAMUSCULAR | Status: DC | PRN

## 2018-05-24 MED ORDER — ZOLPIDEM TARTRATE 5 MG PO TABS: 5.0000 mg | ORAL_TABLET | Freq: Every evening | ORAL | Status: DC | PRN

## 2018-05-24 MED ORDER — COCONUT OIL OIL: 1.0000 "application " | TOPICAL_OIL | Status: DC | PRN

## 2018-05-24 MED ORDER — ACETAMINOPHEN 325 MG PO TABS: 650.0000 mg | ORAL_TABLET | ORAL | Status: DC | PRN

## 2018-05-24 MED ORDER — OXYCODONE HCL 5 MG PO TABS: 5.0000 mg | ORAL_TABLET | ORAL | Status: DC | PRN

## 2018-05-24 MED ORDER — DIPHENHYDRAMINE HCL 25 MG PO CAPS: 25.0000 mg | ORAL_CAPSULE | Freq: Four times a day (QID) | ORAL | Status: DC | PRN

## 2018-05-24 MED ORDER — TETANUS-DIPHTH-ACELL PERTUSSIS 5-2.5-18.5 LF-MCG/0.5 IM SUSP: 0.5000 mL | Freq: Once | INTRAMUSCULAR | Status: DC

## 2018-05-24 NOTE — Progress Notes (Signed)
Some cramping. Bleeding light. Voiding.  Vitals:   05/24/18 0454 05/24/18 0810  BP: 97/70 99/65  Pulse: 93   Resp: 17   Temp: 98 F (36.7 C)   SpO2: 100%    FFNT  Results for orders placed or performed during the hospital encounter of 05/23/18 (from the past 24 hour(s))  CBC     Status: Abnormal   Collection Time: 05/23/18  4:00 PM  Result Value Ref Range   WBC 11.4 (H) 4.0 - 10.5 K/uL   RBC 3.63 (L) 3.87 - 5.11 MIL/uL   Hemoglobin 11.8 (L) 12.0 - 15.0 g/dL   HCT 16.5 (L) 53.7 - 48.2 %   MCV 97.5 80.0 - 100.0 fL   MCH 32.5 26.0 - 34.0 pg   MCHC 33.3 30.0 - 36.0 g/dL   RDW 70.7 86.7 - 54.4 %   Platelets 263 150 - 400 K/uL   nRBC 0.0 0.0 - 0.2 %  Type and screen     Status: None   Collection Time: 05/23/18  5:26 PM  Result Value Ref Range   ABO/RH(D) O POS    Antibody Screen NEG    Sample Expiration      05/26/2018 Performed at [facility], [address], [place], [place] [address]   Comprehensive metabolic panel     Status: Abnormal   Collection Time: 05/23/18  5:26 PM  Result Value Ref Range   Sodium 134 (L) 135 - 145 mmol/L   Potassium 3.6 3.5 - 5.1 mmol/L   Chloride 105 98 - 111 mmol/L   CO2 22 22 - 32 mmol/L   Glucose, Bld 87 70 - 99 mg/dL   BUN 9 6 - 20 mg/dL   Creatinine, Ser 0.71 0.44 - 1.00 mg/dL   Calcium 8.8 (L) 8.9 - 10.3 mg/dL   Total Protein 6.2 (L) 6.5 - 8.1 g/dL   Albumin 3.1 (L) 3.5 - 5.0 g/dL   AST 18 15 - 41 U/L   ALT 11 0 - 44 U/L   Alkaline Phosphatase 58 38 - 126 U/L   Total Bilirubin 0.3 0.3 - 1.2 mg/dL   GFR calc non Af Amer >60 >60 mL/min   GFR calc Af Amer >60 >60 mL/min   Anion gap 7 5 - 15  RPR     Status: None   Collection Time: 05/23/18  5:26 PM  Result Value Ref Range   RPR Ser Ql Non Reactive Non Reactive  CBC     Status: Abnormal   Collection Time: 05/24/18  6:48 AM  Result Value Ref Range   WBC 12.5 (H) 4.0 - 10.5 K/uL   RBC 3.30 (L) 3.87 - 5.11 MIL/uL   Hemoglobin 10.6 (L) 12.0 - 15.0 g/dL   HCT 21.9 (L) 75.8 -  46.0 %   MCV 97.6 80.0 - 100.0 fL   MCH 32.1 26.0 - 34.0 pg   MCHC 32.9 30.0 - 36.0 g/dL   RDW 83.2 54.9 - 82.6 %   Platelets 218 150 - 400 K/uL   nRBC 0.0 0.0 - 0.2 %    A/P: D/C home         Instructions reviewed. FU office this week.

## 2018-05-24 NOTE — Discharge Summary (Signed)
Obstetric Discharge Summary Reason for Admission: spontaneous abortion Prenatal Procedures: none Intrapartum Procedures: spontaneous vaginal delivery Postpartum Procedures: none Complications-Operative and Postpartum: none Hemoglobin  Date Value Ref Range Status  05/24/2018 10.6 (L) 12.0 - 15.0 g/dL Final   HCT  Date Value Ref Range Status  05/24/2018 32.2 (L) 36.0 - 46.0 % Final    Physical Exam:  General: alert, cooperative and no distress Lochia: appropriate Uterine Fundus: firm Incision: healing well DVT Evaluation: No evidence of DVT seen on physical exam.  Discharge Diagnoses: Incompetent cervix  Discharge Information: Date: 05/24/2018 Activity: pelvic rest Diet: routine Medications: PNV, Ibuprofen and tylenol, oxycodone Condition: stable Instructions: refer to practice specific booklet Discharge to: home   Newborn Data: Live born female  Birth Weight: 6.4 oz (181 g) APGAR: ,   Newborn Delivery   Birth date/time:  05/23/2018 18:51:00 Delivery type:  Vaginal, Spontaneous     Home with mother.  Roselle Locus II 05/24/2018, 8:57 AM

## 2018-05-27 DIAGNOSIS — R000 Tachycardia, unspecified: Secondary | ICD-10-CM | POA: Diagnosis not present

## 2018-05-28 DIAGNOSIS — D509 Iron deficiency anemia, unspecified: Secondary | ICD-10-CM | POA: Diagnosis not present

## 2018-05-28 DIAGNOSIS — O039 Complete or unspecified spontaneous abortion without complication: Secondary | ICD-10-CM | POA: Diagnosis not present

## 2018-06-04 DIAGNOSIS — G35 Multiple sclerosis: Secondary | ICD-10-CM | POA: Diagnosis not present

## 2018-06-04 DIAGNOSIS — R899 Unspecified abnormal finding in specimens from other organs, systems and tissues: Secondary | ICD-10-CM | POA: Diagnosis not present

## 2018-06-04 DIAGNOSIS — O039 Complete or unspecified spontaneous abortion without complication: Secondary | ICD-10-CM | POA: Diagnosis not present

## 2018-06-08 DIAGNOSIS — Z832 Family history of diseases of the blood and blood-forming organs and certain disorders involving the immune mechanism: Secondary | ICD-10-CM | POA: Diagnosis not present

## 2018-06-08 DIAGNOSIS — Z315 Encounter for genetic counseling: Secondary | ICD-10-CM | POA: Diagnosis not present

## 2018-06-09 DIAGNOSIS — Z3201 Encounter for pregnancy test, result positive: Secondary | ICD-10-CM | POA: Diagnosis not present

## 2018-06-09 DIAGNOSIS — N912 Amenorrhea, unspecified: Secondary | ICD-10-CM | POA: Diagnosis not present

## 2018-06-11 DIAGNOSIS — N912 Amenorrhea, unspecified: Secondary | ICD-10-CM | POA: Diagnosis not present

## 2018-06-22 DIAGNOSIS — O021 Missed abortion: Secondary | ICD-10-CM | POA: Diagnosis not present

## 2018-07-03 DIAGNOSIS — Z30430 Encounter for insertion of intrauterine contraceptive device: Secondary | ICD-10-CM | POA: Diagnosis not present

## 2018-08-26 ENCOUNTER — Encounter: Payer: Self-pay | Admitting: Pharmacy Technician

## 2018-08-26 NOTE — Progress Notes (Signed)
Patient has been approved for drug assistance by [place] for Ocrevus. The enrollment period is from 08/21/18-08/20/19 based on self pay. First DOS covered is May 2020.

## 2018-09-24 ENCOUNTER — Other Ambulatory Visit: Payer: Self-pay | Admitting: Obstetrics & Gynecology

## 2018-09-24 ENCOUNTER — Ambulatory Visit
Admission: RE | Admit: 2018-09-24 | Discharge: 2018-09-24 | Disposition: A | Discharge status: Home / Self Care | Payer: No Typology Code available for payment source | Source: Ambulatory Visit | Attending: Obstetrics & Gynecology | Admitting: Obstetrics & Gynecology

## 2018-09-24 DIAGNOSIS — Z331 Pregnant state, incidental: Secondary | ICD-10-CM

## 2018-09-24 IMAGING — CR ABDOMEN - 1 VIEW
3 series · 3 of 3 positions shown · non-contrast
Comparison: None.

CLINICAL DATA: Failed IUD. Rule out intra-abdominal ParaGard IUD.
Patient in the first trimester of pregnancy.

EXAM:
ABDOMEN - 1 VIEW

[t abdomen supine]
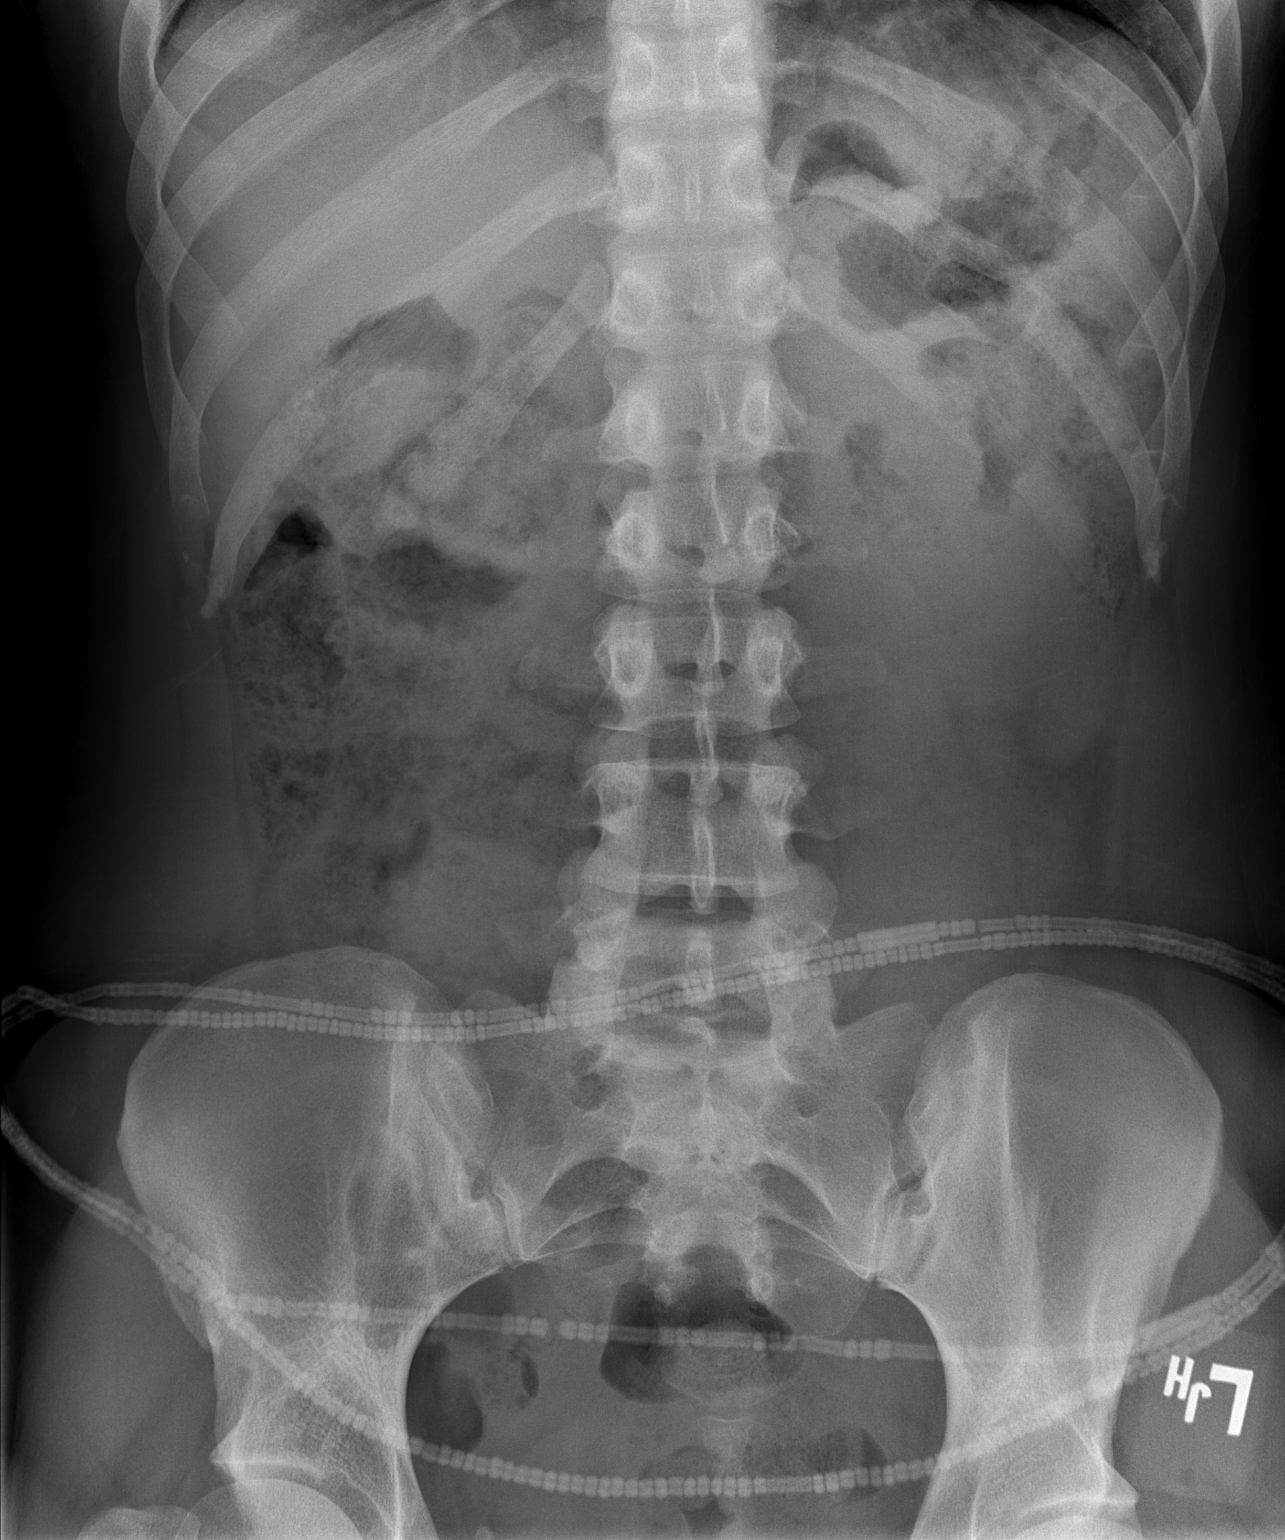

[t abdomen supine * (1 of 2)]
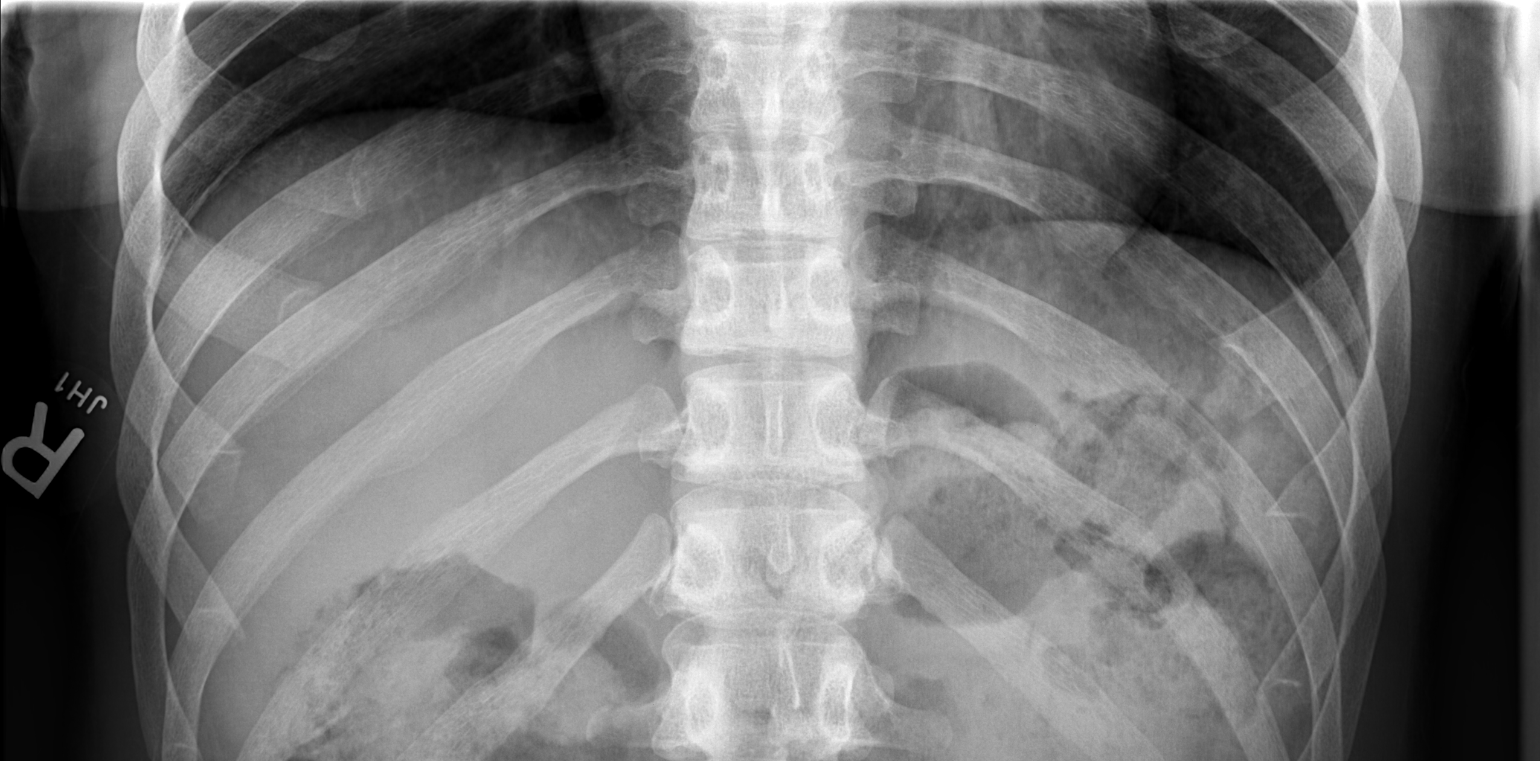

[t abdomen supine * (2 of 2)]
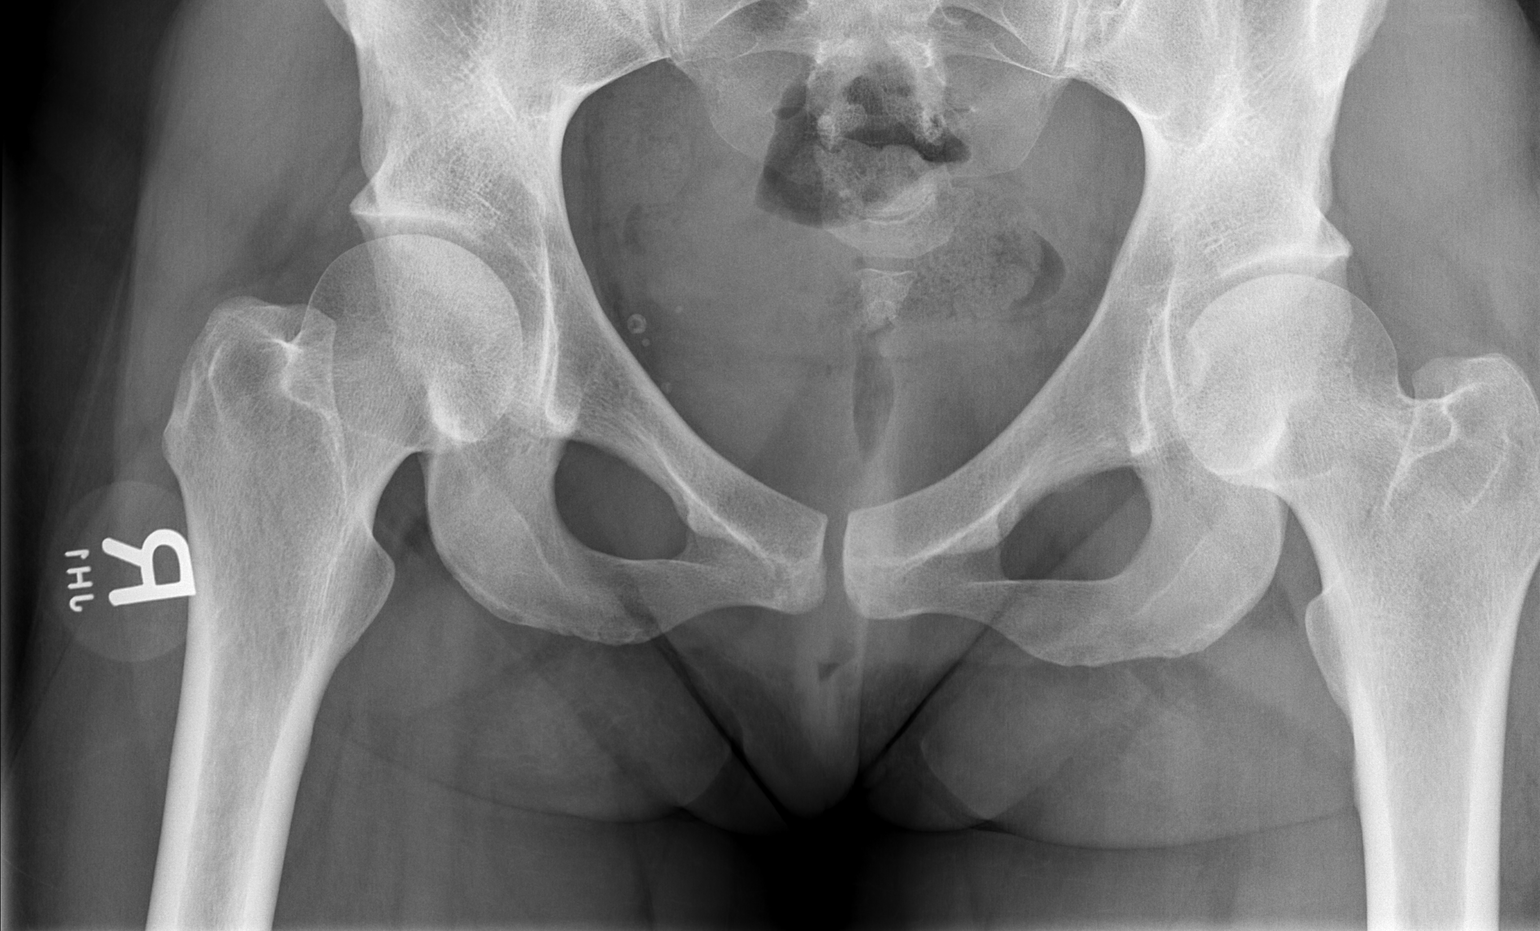

[3 of 3 positions shown; findings below may reference images not displayed]

FINDINGS: No radiopaque foreign body. No dilated small bowel loops. Moderate
colonic stool. No radiopaque nephrolithiasis. Clear lung bases.
Visualized osseous structures appear intact.
IMPRESSION: No radiopaque foreign body. Specifically, no evidence of a
radiopaque IUD. Nonobstructive bowel gas pattern. Moderate colonic
stool volume may indicate constipation.

## 2018-10-06 LAB — OB RESULTS CONSOLE RPR: RPR: NONREACTIVE

## 2018-10-06 LAB — OB RESULTS CONSOLE GC/CHLAMYDIA
Chlamydia: NEGATIVE
Gonorrhea: NEGATIVE

## 2018-10-06 LAB — OB RESULTS CONSOLE ABO/RH: RH Type: POSITIVE

## 2018-10-06 LAB — OB RESULTS CONSOLE HEPATITIS B SURFACE ANTIGEN: Hepatitis B Surface Ag: NEGATIVE

## 2018-10-06 LAB — OB RESULTS CONSOLE HIV ANTIBODY (ROUTINE TESTING): HIV: NONREACTIVE

## 2018-10-06 LAB — OB RESULTS CONSOLE RUBELLA ANTIBODY, IGM: Rubella: IMMUNE

## 2018-10-06 LAB — OB RESULTS CONSOLE ANTIBODY SCREEN: Antibody Screen: NEGATIVE

## 2018-11-12 ENCOUNTER — Encounter (HOSPITAL_BASED_OUTPATIENT_CLINIC_OR_DEPARTMENT_OTHER): Payer: Self-pay | Admitting: *Deleted

## 2018-11-13 ENCOUNTER — Other Ambulatory Visit (HOSPITAL_COMMUNITY)
Admission: RE | Admit: 2018-11-13 | Discharge: 2018-11-13 | Disposition: A | Discharge status: Home / Self Care | Payer: Medicaid Other | Source: Ambulatory Visit | Attending: Obstetrics & Gynecology | Admitting: Obstetrics & Gynecology

## 2018-11-13 ENCOUNTER — Other Ambulatory Visit: Payer: Self-pay

## 2018-11-13 ENCOUNTER — Encounter (HOSPITAL_BASED_OUTPATIENT_CLINIC_OR_DEPARTMENT_OTHER): Payer: Self-pay | Admitting: *Deleted

## 2018-11-13 DIAGNOSIS — Z01812 Encounter for preprocedural laboratory examination: Secondary | ICD-10-CM | POA: Diagnosis not present

## 2018-11-13 DIAGNOSIS — Z20828 Contact with and (suspected) exposure to other viral communicable diseases: Secondary | ICD-10-CM | POA: Insufficient documentation

## 2018-11-13 LAB — SARS CORONAVIRUS 2 (TAT 6-24 HRS): SARS Coronavirus 2: NEGATIVE

## 2018-11-13 NOTE — Progress Notes (Signed)
Spoke w/ pt via phone for pre-op interview.  Npo after mn w/ exception clear liquids until 0700 then nothing , pt  verbalized understanding.  Arrive at 1100.  Needs cbc.  Pt had covid test done today.

## 2018-11-16 NOTE — H&P (Signed)
Kimberly Strickland is a 28 y.o. female G3P1011 at 25 weeks presenting for history indicated cerclage.  Patient has obstetric history complicated by cervical incompetence with PTD at 18 weeks in 05/2018.  Last u/s on 7/29 CL 4.2 cm.  OB History    Gravida  3   Para  2   Term  1   Preterm      AB      Living  1     SAB      TAB      Ectopic      Multiple  0   Live Births  1          Past Medical History:  Diagnosis Date  . Anemia    with pregnancy  . Family history of adverse reaction to anesthesia    mother  "drops her heart rate"  . History of abnormal cervical Pap smear   . History of incompetent cervix, currently pregnant   . MS (multiple sclerosis) [facility])    neurologist-- dr Marnee Spring @ [facility]---  relapsing type,  (11-13-2018 per pt currently no treatment due to pregnancy)  . Occipital neuralgia    Past Surgical History:  Procedure Laterality Date  . ESOPHAGOGASTRODUODENOSCOPY (EGD) WITH PROPOFOL  2017  approx.  . NO PAST SURGERIES     Family History: family history is not on file. Social History:  reports that she quit smoking about 6 years ago. Her smoking use included cigars. She has never used smokeless tobacco. She reports previous alcohol use. She reports previous drug use. Drug: Marijuana.     Maternal Diabetes: No Genetic Screening: Normal Maternal Ultrasounds/Referrals: Normal Fetal Ultrasounds or other Referrals:  None Maternal Substance Abuse:  No Significant Maternal Medications:  None Significant Maternal Lab Results:  None Other Comments:  None  ROS Maternal Medical History:  Prenatal complications: no prenatal complications Prenatal Complications - Diabetes: none.      Height 5\' 7"  (1.702 m), weight 79.4 kg, last menstrual period 08/02/2018, not currently breastfeeding. Maternal Exam:  Abdomen: Patient reports no abdominal tenderness. Fundal height is c/w dates.       Physical Exam  Constitutional: She is oriented to  person, place, and time. She appears well-developed and well-nourished.  GI: Soft. There is no rebound and no guarding.  Neurological: She is alert and oriented to person, place, and time.  Skin: Skin is warm and dry.  Psychiatric: She has a normal mood and affect. Her behavior is normal.    Prenatal labs: ABO, Rh: --/--/O POS (02/15 1726) Antibody: NEG (02/15 1726) Rubella:  Immune RPR: Non Reactive (02/15 1726)  HBsAg:   NR HIV:   NR GBS:   n/a  Assessment/Plan: 28yo G3P1011 at 14 weeks for history indicated cerclage McDonald Cerclage Patient counseled re: risk of bleeding, infection and scarring.  She understands the risk of cerclage failure and need to remove if labor to avoid cervical laceration.  She also understands risk of PPROM during procedure.  All questions were answered and patient wishes to proceed.    Kimberly Strickland 11/16/2018, 5:03 PM

## 2018-11-17 ENCOUNTER — Encounter (HOSPITAL_BASED_OUTPATIENT_CLINIC_OR_DEPARTMENT_OTHER)
Admission: RE | Disposition: A | Discharge status: Home / Self Care | Payer: Self-pay | Source: Home / Self Care | Attending: Obstetrics & Gynecology

## 2018-11-17 ENCOUNTER — Ambulatory Visit (HOSPITAL_BASED_OUTPATIENT_CLINIC_OR_DEPARTMENT_OTHER): Payer: Medicaid Other | Admitting: Anesthesiology

## 2018-11-17 ENCOUNTER — Ambulatory Visit (HOSPITAL_BASED_OUTPATIENT_CLINIC_OR_DEPARTMENT_OTHER)
Admission: RE | Admit: 2018-11-17 | Discharge: 2018-11-17 | Disposition: A | Discharge status: Home / Self Care | Payer: Medicaid Other | Attending: Obstetrics & Gynecology | Admitting: Obstetrics & Gynecology

## 2018-11-17 ENCOUNTER — Other Ambulatory Visit: Payer: Self-pay

## 2018-11-17 ENCOUNTER — Encounter (HOSPITAL_BASED_OUTPATIENT_CLINIC_OR_DEPARTMENT_OTHER): Payer: Self-pay

## 2018-11-17 DIAGNOSIS — Z87891 Personal history of nicotine dependence: Secondary | ICD-10-CM | POA: Insufficient documentation

## 2018-11-17 DIAGNOSIS — G35 Multiple sclerosis: Secondary | ICD-10-CM | POA: Diagnosis not present

## 2018-11-17 DIAGNOSIS — O3432 Maternal care for cervical incompetence, second trimester: Secondary | ICD-10-CM | POA: Diagnosis not present

## 2018-11-17 DIAGNOSIS — Z3A14 14 weeks gestation of pregnancy: Secondary | ICD-10-CM | POA: Diagnosis not present

## 2018-11-17 DIAGNOSIS — M5481 Occipital neuralgia: Secondary | ICD-10-CM | POA: Insufficient documentation

## 2018-11-17 DIAGNOSIS — D649 Anemia, unspecified: Secondary | ICD-10-CM | POA: Diagnosis not present

## 2018-11-17 HISTORY — DX: Occipital neuralgia: M54.81

## 2018-11-17 HISTORY — DX: Personal history of other diseases of the female genital tract: Z87.42

## 2018-11-17 HISTORY — DX: Family history of other specified conditions: Z84.89

## 2018-11-17 HISTORY — PX: CERVICAL CERCLAGE: SHX1329

## 2018-11-17 HISTORY — DX: Supervision of pregnancy with other poor reproductive or obstetric history, unspecified trimester: O09.299

## 2018-11-17 LAB — CBC
HCT: 37.6 % (ref 36.0–46.0)
Hemoglobin: 12.2 g/dL (ref 12.0–15.0)
MCH: 32.4 pg (ref 26.0–34.0)
MCHC: 32.4 g/dL (ref 30.0–36.0)
MCV: 100 fL (ref 80.0–100.0)
Platelets: 263 10*3/uL (ref 150–400)
RBC: 3.76 MIL/uL — ABNORMAL LOW (ref 3.87–5.11)
RDW: 12.5 % (ref 11.5–15.5)
WBC: 9.9 10*3/uL (ref 4.0–10.5)
nRBC: 0 % (ref 0.0–0.2)

## 2018-11-17 SURGERY — CERCLAGE, CERVIX, VAGINAL APPROACH
Anesthesia: Spinal | Site: Cervix

## 2018-11-17 MED ORDER — LACTATED RINGERS IV SOLN
INTRAVENOUS | Status: DC
  Administered 2018-11-17: 125 mL/h via INTRAVENOUS
  Filled 2018-11-17 (×2): qty 1000

## 2018-11-17 MED ORDER — FENTANYL CITRATE (PF) 100 MCG/2ML IJ SOLN
INTRAMUSCULAR | Status: DC | PRN
  Administered 2018-11-17 (×2): 50 ug via INTRAVENOUS

## 2018-11-17 MED ORDER — ACETAMINOPHEN 325 MG PO TABS
325.0000 mg | ORAL_TABLET | ORAL | Status: DC | PRN
  Filled 2018-11-17: qty 2

## 2018-11-17 MED ORDER — FENTANYL CITRATE (PF) 100 MCG/2ML IJ SOLN
25.0000 ug | INTRAMUSCULAR | Status: DC | PRN
  Filled 2018-11-17: qty 1

## 2018-11-17 MED ORDER — CEFAZOLIN SODIUM-DEXTROSE 2-4 GM/100ML-% IV SOLN
2.0000 g | INTRAVENOUS | Status: AC
  Administered 2018-11-17: 13:00:00 2 g via INTRAVENOUS
  Filled 2018-11-17: qty 100

## 2018-11-17 MED ORDER — MEPERIDINE HCL 25 MG/ML IJ SOLN
6.2500 mg | INTRAMUSCULAR | Status: DC | PRN
  Filled 2018-11-17: qty 1

## 2018-11-17 MED ORDER — LACTATED RINGERS IV SOLN
INTRAVENOUS | Status: DC
  Administered 2018-11-17: 1000 mL via INTRAVENOUS
  Filled 2018-11-17: qty 1000

## 2018-11-17 MED ORDER — EPHEDRINE 5 MG/ML INJ
INTRAVENOUS | Status: AC
  Filled 2018-11-17: qty 10

## 2018-11-17 MED ORDER — CEFAZOLIN SODIUM-DEXTROSE 2-4 GM/100ML-% IV SOLN
INTRAVENOUS | Status: AC
  Filled 2018-11-17: qty 100

## 2018-11-17 MED ORDER — ACETAMINOPHEN 160 MG/5ML PO SOLN
325.0000 mg | ORAL | Status: DC | PRN
  Filled 2018-11-17: qty 20.3

## 2018-11-17 MED ORDER — ONDANSETRON HCL 4 MG/2ML IJ SOLN
INTRAMUSCULAR | Status: AC
  Filled 2018-11-17: qty 2

## 2018-11-17 MED ORDER — EPHEDRINE SULFATE-NACL 50-0.9 MG/10ML-% IV SOSY
PREFILLED_SYRINGE | INTRAVENOUS | Status: DC | PRN
  Administered 2018-11-17 (×3): 10 mg via INTRAVENOUS

## 2018-11-17 MED ORDER — BUPIVACAINE IN DEXTROSE 0.75-8.25 % IT SOLN
INTRATHECAL | Status: DC | PRN
  Administered 2018-11-17: 1.3 mL via INTRATHECAL

## 2018-11-17 MED ORDER — FENTANYL CITRATE (PF) 100 MCG/2ML IJ SOLN
INTRAMUSCULAR | Status: AC
  Filled 2018-11-17: qty 2

## 2018-11-17 MED ORDER — OXYCODONE HCL 5 MG/5ML PO SOLN
5.0000 mg | Freq: Once | ORAL | Status: DC | PRN
  Filled 2018-11-17: qty 5

## 2018-11-17 MED ORDER — OXYCODONE HCL 5 MG PO TABS
5.0000 mg | ORAL_TABLET | Freq: Once | ORAL | Status: DC | PRN
  Filled 2018-11-17: qty 1

## 2018-11-17 MED ORDER — DIPHENHYDRAMINE HCL 50 MG/ML IJ SOLN
INTRAMUSCULAR | Status: AC
  Filled 2018-11-17: qty 1

## 2018-11-17 MED ORDER — ONDANSETRON HCL 4 MG/2ML IJ SOLN
4.0000 mg | Freq: Once | INTRAMUSCULAR | Status: AC | PRN
  Administered 2018-11-17: 4 mg via INTRAVENOUS
  Filled 2018-11-17: qty 2

## 2018-11-17 MED ORDER — ONDANSETRON HCL 4 MG/2ML IJ SOLN
INTRAMUSCULAR | Status: DC | PRN
  Administered 2018-11-17: 4 mg via INTRAVENOUS

## 2018-11-17 MED ORDER — DIPHENHYDRAMINE HCL 50 MG/ML IJ SOLN
INTRAMUSCULAR | Status: DC | PRN
  Administered 2018-11-17 (×2): 12.5 mg via INTRAVENOUS

## 2018-11-17 SURGICAL SUPPLY — 20 items
CANISTER SUCT 3000ML PPV (MISCELLANEOUS) IMPLANT
GLOVE BIO SURGEON STRL SZ 6 (GLOVE) ×2 IMPLANT
GLOVE BIOGEL PI IND STRL 6 (GLOVE) ×2 IMPLANT
GLOVE BIOGEL PI IND STRL 7.0 (GLOVE) ×1 IMPLANT
GLOVE BIOGEL PI INDICATOR 6 (GLOVE) ×2
GLOVE BIOGEL PI INDICATOR 7.0 (GLOVE) ×1
GOWN STRL REUS W/TWL LRG LVL3 (GOWN DISPOSABLE) ×4 IMPLANT
HOLDER FOLEY CATH W/STRAP (MISCELLANEOUS) ×2 IMPLANT
NEEDLE MAYO CATGUT SZ4 (NEEDLE) ×2 IMPLANT
PACK VAGINAL MINOR WOMEN LF (CUSTOM PROCEDURE TRAY) ×2 IMPLANT
PAD OB MATERNITY 4.3X12.25 (PERSONAL CARE ITEMS) ×2 IMPLANT
PAD PREP 24X48 CUFFED NSTRL (MISCELLANEOUS) ×2 IMPLANT
SUT ETHIBOND  5 (SUTURE)
SUT ETHIBOND 5 (SUTURE) IMPLANT
SUT ETHIBOND 5 LR DA (SUTURE) ×2 IMPLANT
SYR BULB 3OZ (MISCELLANEOUS) ×2 IMPLANT
TOWEL OR 17X24 6PK STRL BLUE (TOWEL DISPOSABLE) ×2 IMPLANT
TRAY FOLEY W/BAG SLVR 14FR (SET/KITS/TRAYS/PACK) ×2 IMPLANT
TUBING NON-CON 1/4 X 20 CONN (TUBING) IMPLANT
YANKAUER SUCT BULB TIP NO VENT (SUCTIONS) IMPLANT

## 2018-11-17 NOTE — Anesthesia Procedure Notes (Signed)
Spinal  Patient location during procedure: OR Start time: 11/17/2018 1:06 PM End time: 11/17/2018 1:11 PM Staffing Anesthesiologist: Janeece Riggers, MD Preanesthetic Checklist Completed: patient identified, site marked, surgical consent, pre-op evaluation, timeout performed, IV checked, risks and benefits discussed and monitors and equipment checked Spinal Block Patient position: sitting Prep: DuraPrep Patient monitoring: heart rate, cardiac monitor, continuous pulse ox and blood pressure Approach: midline Location: L3-4 Injection technique: single-shot Needle Needle type: Sprotte  Needle gauge: 24 G Needle length: 9 cm Assessment Sensory level: T4

## 2018-11-17 NOTE — Op Note (Signed)
Kimberly Strickland PROCEDURE DATE: 11/17/2018  PREOPERATIVE DIAGNOSIS: History of cervical insufficiency  POSTOPERATIVE DIAGNOSIS: The same  PROCEDURE:  History indicated McDonald cervical cerclage  SURGEON:  Dr. [name]  INDICATIONS: Kimberly Strickland is a 28 y.o. G3P1011 at 14 weeks with history of cervical incompetence in previous pregnancy  FINDINGS:  Normal appearing cervix; visually closed .   ANESTHESIA:  Spinal  ESTIMATED BLOOD LOSS: 20 cc  SPECIMENS: None  COMPLICATIONS: None immediate  PROCEDURE IN DETAIL:  The patient was brought to the OR where she was placed in dorsal lithotomy after spinal anesthesia was administered.  2g Ancef was administered.  Gentle vaginal prep was performed using betadine.  The patient was draped and a timeout was performed.  Foley catheter was placed and drained clear urine throughout the case.  Weighted speculum was placed and Deaver retractor anteriorly.  Using a ring forceps, the anterior lip of the cervix was grasped.  Using Ethibond suture on a free needle, the first purse string stitch was placed starting at 12 oclock at the level of the internal cervical os being mindful of bladder location.  4 additional purse string stitches were placed circumferentially walking the ring forceps around the cervix for retraction..  The suture is tied at 54 oclock with 8 knots.  There was good hemostasis at the end of the procedure.  Copious saline irrigation was performed.  The patient tolerated the procedure well.  All counts were correct x 2.  The patient went to the recovery room in stable condition.

## 2018-11-17 NOTE — Anesthesia Procedure Notes (Signed)
Procedure Name: MAC Date/Time: 11/17/2018 1:01 PM Performed by: Janeece Riggers, MD Pre-anesthesia Checklist: Patient identified, Emergency Drugs available, Suction available and Patient being monitored Oxygen Delivery Method: Simple face mask Placement Confirmation: positive ETCO2 and CO2 detector

## 2018-11-17 NOTE — Anesthesia Preprocedure Evaluation (Addendum)
Anesthesia Evaluation  Patient identified by MRN, date of birth, ID band Patient awake    Reviewed: Allergy & Precautions, H&P , NPO status , Patient's Chart, lab work & pertinent test results, reviewed documented beta blocker date and time   History of Anesthesia Complications (+) Family history of anesthesia reaction  Airway Mallampati: I  TM Distance: >3 FB Neck ROM: full    Dental no notable dental hx. (+) Teeth Intact, Dental Advisory Given   Pulmonary neg pulmonary ROS, former smoker,    Pulmonary exam normal breath sounds clear to auscultation       Cardiovascular Exercise Tolerance: Good negative cardio ROS   Rhythm:regular Rate:Normal     Neuro/Psych MS   neurologist-- dr Marnee Spring @ [facility]---  relapsing type,  (11-13-2018 per pt currently no treatment due to pregnancy)   Neuromuscular disease negative psych ROS   GI/Hepatic negative GI ROS, Neg liver ROS,   Endo/Other  negative endocrine ROS  Renal/GU negative Renal ROS  negative genitourinary   Musculoskeletal negative musculoskeletal ROS (+)   Abdominal   Peds  Hematology negative hematology ROS (+) anemia ,   Anesthesia Other Findings   Reproductive/Obstetrics negative OB ROS                           Anesthesia Physical Anesthesia Plan  ASA: II  Anesthesia Plan: Spinal   Post-op Pain Management:    Induction:   PONV Risk Score and Plan:   Airway Management Planned: Nasal Cannula and Simple Face Mask  Additional Equipment:   Intra-op Plan:   Post-operative Plan:   Informed Consent: I have reviewed the patients History and Physical, chart, labs and discussed the procedure including the risks, benefits and alternatives for the proposed anesthesia with the patient or authorized representative who has indicated his/her understanding and acceptance.     Dental Advisory Given  Plan Discussed with:  CRNA, Anesthesiologist and Surgeon  Anesthesia Plan Comments: (  )        Anesthesia Quick Evaluation

## 2018-11-17 NOTE — Progress Notes (Signed)
Fetal heart rate 147

## 2018-11-17 NOTE — Anesthesia Postprocedure Evaluation (Signed)
Anesthesia Post Note  Patient: Restaurant manager, [employer] food  Procedure(s) Performed: CERCLAGE CERVICAL (N/A Cervix)     Patient location during evaluation: PACU Anesthesia Type: Spinal Level of consciousness: oriented and awake and alert Pain management: pain level controlled Vital Signs Assessment: post-procedure vital signs reviewed and stable Respiratory status: spontaneous breathing, respiratory function stable and patient connected to nasal cannula oxygen Cardiovascular status: blood pressure returned to baseline and stable Postop Assessment: no headache, no backache and no apparent nausea or vomiting Anesthetic complications: no    Last Vitals:  Vitals:   11/17/18 1500 11/17/18 1525  BP: 102/66 (!) 92/58  Pulse: 75 79  Resp: 20 15  Temp:  36.6 C  SpO2: 100% 100%    Last Pain:  Vitals:   11/17/18 1525  TempSrc:   PainSc: 0-No pain                 Ta Fair

## 2018-11-17 NOTE — Transfer of Care (Signed)
  Last Vitals:  Vitals Value Taken Time  BP 97/73 11/17/18 1401  Temp 36.5 C 11/17/18 1350  Pulse 97 11/17/18 1404  Resp 14 11/17/18 1404  SpO2 100 % 11/17/18 1404  Vitals shown include unvalidated device data.  Last Pain:  Vitals:   11/17/18 1203  TempSrc: Oral  PainSc: 0-No pain      Patients Stated Pain Goal: 3 (11/17/18 1203)  Immediate Anesthesia Transfer of Care Note  Patient: Kimberly Strickland  Procedure(s) Performed: Procedure(s) (LRB): CERCLAGE CERVICAL (N/A)  Patient Location: PACU  Anesthesia Type: Spinal  Level of Consciousness: awake, alert  and oriented  Airway & Oxygen Therapy: Patient Spontanous Breathing and Patient connected to face mask oxygen  Post-op Assessment: Report given to PACU RN and Post -op Vital signs reviewed and stable  Post vital signs: Reviewed and stable  Complications: No apparent anesthesia complications

## 2018-11-17 NOTE — Progress Notes (Signed)
No change to H&P.  Devontay Celaya, DO 

## 2018-11-17 NOTE — Progress Notes (Signed)
Notified Dr. Lynnette Caffey of patient being able to ambulate inside of room without difficulty. Has full sensation to BLE, + purposeful movement to BLE. Dr. Lynnette Caffey stated to D/C patient's foley catheter and D/C patient as ordered. Instructed if patient unable to void within 6 hours of discharge to go directly to MAU and/or notify physician per Dr. Lynnette Caffey.

## 2018-11-18 ENCOUNTER — Encounter (HOSPITAL_BASED_OUTPATIENT_CLINIC_OR_DEPARTMENT_OTHER): Payer: Self-pay | Admitting: Obstetrics & Gynecology

## 2019-02-19 ENCOUNTER — Inpatient Hospital Stay (HOSPITAL_COMMUNITY)
Admission: AD | Admit: 2019-02-19 | Discharge: 2019-02-19 | Disposition: A | Discharge status: Home / Self Care | Payer: Medicaid Other | Attending: Obstetrics and Gynecology | Admitting: Obstetrics and Gynecology

## 2019-02-19 ENCOUNTER — Other Ambulatory Visit: Payer: Self-pay

## 2019-02-19 ENCOUNTER — Encounter (HOSPITAL_COMMUNITY): Payer: Self-pay | Admitting: *Deleted

## 2019-02-19 DIAGNOSIS — O99352 Diseases of the nervous system complicating pregnancy, second trimester: Secondary | ICD-10-CM | POA: Diagnosis not present

## 2019-02-19 DIAGNOSIS — G35 Multiple sclerosis: Secondary | ICD-10-CM | POA: Insufficient documentation

## 2019-02-19 DIAGNOSIS — Z3A27 27 weeks gestation of pregnancy: Secondary | ICD-10-CM

## 2019-02-19 DIAGNOSIS — R102 Pelvic and perineal pain: Secondary | ICD-10-CM

## 2019-02-19 DIAGNOSIS — O3432 Maternal care for cervical incompetence, second trimester: Secondary | ICD-10-CM

## 2019-02-19 DIAGNOSIS — N949 Unspecified condition associated with female genital organs and menstrual cycle: Secondary | ICD-10-CM | POA: Diagnosis not present

## 2019-02-19 DIAGNOSIS — O26892 Other specified pregnancy related conditions, second trimester: Secondary | ICD-10-CM

## 2019-02-19 HISTORY — DX: Spinal muscular atrophy, unspecified: G12.9

## 2019-02-19 LAB — URINALYSIS, ROUTINE W REFLEX MICROSCOPIC
Bilirubin Urine: NEGATIVE
Glucose, UA: NEGATIVE mg/dL
Hgb urine dipstick: NEGATIVE
Ketones, ur: NEGATIVE mg/dL
Nitrite: NEGATIVE
Protein, ur: 30 mg/dL — AB
Specific Gravity, Urine: 1.026 (ref 1.005–1.030)
pH: 7 (ref 5.0–8.0)

## 2019-02-19 NOTE — MAU Note (Signed)
Had a miscarriage in Feb.  Has a cerclage. Feels like a dropping sensation in vaginal area.  Usually can feel stitches, can't really feel them now. Has had a very slight pain, very infrequent, not currently and not even daily.

## 2019-02-19 NOTE — MAU Provider Note (Signed)
Chief Complaint:  pelvic pressure   First Provider Initiated Contact with Patient 02/19/19 1911      HPI: Kimberly Strickland is a 28 y.o. G3P1001 at [redacted]w[redacted]d who presents to maternity admissions reporting an increase in pelvic pressure and a feeling of "dropping" low in her pelvis today.  She has a cerclage for hx of incompetent cervix and delivery at 18 weeks so is concerned.  She has mild pain, that is intermittent pressure and does not radiate.  There are no other symptoms. She has not tried any treatments. She reports good fetal movement.  HPI  Past Medical History: Past Medical History:  Diagnosis Date  . Anemia    with pregnancy  . Family history of adverse reaction to anesthesia    mother  "drops her heart rate"  . History of abnormal cervical Pap smear   . History of incompetent cervix, currently pregnant   . MS (multiple sclerosis) [facility])    neurologist-- dr Youlanda Mighty @ [facility]---  relapsing type,  (11-13-2018 per pt currently no treatment due to pregnancy)  . Occipital neuralgia   . SMA (spinal muscular atrophy) (HCC)     Past obstetric history: OB History  Gravida Para Term Preterm AB Living  3 2 1     1   SAB TAB Ectopic Multiple Live Births        0 1    # Outcome Date GA Lbr Len/2nd Weight Sex Delivery Anes PTL Lv  3 Current           2 Para 05/23/18 [redacted]w[redacted]d 03:30 / 00:21 181 g F Vag-Spont None  FD  1 Term 08/25/17 [redacted]w[redacted]d 22:00 / 00:05 3005 g F Vag-Spont None  LIV    Past Surgical History: Past Surgical History:  Procedure Laterality Date  . CERVICAL CERCLAGE N/A 11/17/2018   Procedure: CERCLAGE CERVICAL;  Surgeon: 01/17/2019, DO;  Location: [facility];  Service: Gynecology;  Laterality: N/A;  . ESOPHAGOGASTRODUODENOSCOPY (EGD) WITH PROPOFOL  2017  approx.  . Multiple Sclerosis    . NO PAST SURGERIES      Family History: Family History  Problem Relation Age of Onset  . Thyroid disease Mother     Social History: Social History    Tobacco Use  . Smoking status: Former Smoker    Types: Cigars    Quit date: 2014    Years since quitting: 6.8  . Smokeless tobacco: Never Used  Substance Use Topics  . Alcohol use: Not Currently    Frequency: Never  . Drug use: Not Currently    Types: Marijuana    Allergies: No Known Allergies  Meds:  No medications prior to admission.    ROS:  Review of Systems  Constitutional: Negative for chills, fatigue and fever.  Eyes: Negative for visual disturbance.  Respiratory: Negative for shortness of breath.   Cardiovascular: Negative for chest pain.  Gastrointestinal: Negative for abdominal pain, nausea and vomiting.  Genitourinary: Positive for pelvic pain. Negative for difficulty urinating, dysuria, flank pain, vaginal bleeding, vaginal discharge and vaginal pain.  Neurological: Negative for dizziness and headaches.  Psychiatric/Behavioral: Negative.      I have reviewed patient's Past Medical Hx, Surgical Hx, Family Hx, Social Hx, medications and allergies.   Physical Exam   Patient Vitals for the past 24 hrs:  BP Temp Temp src Pulse Resp SpO2 Height Weight  02/19/19 1949 99/72 98.5 F (36.9 C) Oral 85 18 - - -  02/19/19 1748 99/66 98.4 F (  36.9 C) Oral 94 18 98 % 5\' 7"  (1.702 m) 90.7 kg   Constitutional: Well-developed, well-nourished female in no acute distress.  Cardiovascular: normal rate Respiratory: normal effort GI: Abd soft, non-tender, gravid appropriate for gestational age.  MS: Extremities nontender, no edema, normal ROM Neurologic: Alert and oriented x 4.  GU: Neg CVAT.  PELVIC EXAM: Cervix pink, visually closed and long, cerclage visualized at 12 0'clock wnl, no tension on cerclage, scant white creamy discharge, vaginal walls and external genitalia normal   Effacement (%): Thick Cervical Position: Posterior Exam by:: Fatima Blank, CNM  FHT:  Baseline 145 , moderate variability, accelerations present, no decelerations Contractions:  None on toco or to palpation   Labs: Results for orders placed or performed during the hospital encounter of 02/19/19 (from the past 24 hour(s))  Urinalysis, Routine w reflex microscopic     Status: Abnormal   Collection Time: 02/19/19  6:18 PM  Result Value Ref Range   Color, Urine YELLOW YELLOW   APPearance CLOUDY (A) CLEAR   Specific Gravity, Urine 1.026 1.005 - 1.030   pH 7.0 5.0 - 8.0   Glucose, UA NEGATIVE NEGATIVE mg/dL   Hgb urine dipstick NEGATIVE NEGATIVE   Bilirubin Urine NEGATIVE NEGATIVE   Ketones, ur NEGATIVE NEGATIVE mg/dL   Protein, ur 30 (A) NEGATIVE mg/dL   Nitrite NEGATIVE NEGATIVE   Leukocytes,Ua SMALL (A) NEGATIVE   RBC / HPF 0-5 0 - 5 RBC/hpf   WBC, UA 0-5 0 - 5 WBC/hpf   Bacteria, UA MANY (A) NONE SEEN   Squamous Epithelial / LPF 0-5 0 - 5   Mucus PRESENT    Amorphous Crystal PRESENT    --/--/O POS (02/15 1726)  Imaging:  No results found.  MAU Course/MDM: Orders Placed This Encounter  Procedures  . Urinalysis, Routine w reflex microscopic  . Discharge patient    No orders of the defined types were placed in this encounter.    NST reviewed and reactive Cerclage wnl, cervix long closed, no evidence of PTL F/U in office as scheduled  Return to MAU as needed Pt discharge with strict PTL precautions.    Assessment: 1. Cervical insufficiency during pregnancy in second trimester, antepartum   2. Pelvic pressure in pregnancy, antepartum, third trimester     Plan: Discharge home Labor precautions and fetal kick counts Follow-up Information    [place] Follow up.   Why: As scheduled, return to MAU as needed for emergency. Contact information: [place] [address] [phone]          Allergies as of 02/19/2019   No Known Allergies     Medication List    TAKE these medications   B-6 PO Take by mouth daily.   docusate sodium 100 MG capsule Commonly known as: COLACE Take  100 mg by mouth 2 (two) times daily as needed for mild constipation.   doxylamine (Sleep) 25 MG tablet Commonly known as: UNISOM Take 25 mg by mouth at bedtime as needed.   prenatal multivitamin Tabs tablet Take 1 tablet by mouth daily at 12 noon.   VITA-C PO Take by mouth daily.       Fatima Blank Certified Nurse-Midwife 02/19/2019 8:09 PM

## 2019-04-19 ENCOUNTER — Ambulatory Visit: Payer: Medicaid Other | Attending: Internal Medicine

## 2019-04-19 DIAGNOSIS — Z20822 Contact with and (suspected) exposure to covid-19: Secondary | ICD-10-CM

## 2019-04-19 LAB — OB RESULTS CONSOLE GBS: GBS: NEGATIVE

## 2019-04-20 LAB — NOVEL CORONAVIRUS, NAA: SARS-CoV-2, NAA: NOT DETECTED

## 2019-04-28 ENCOUNTER — Encounter (HOSPITAL_COMMUNITY): Payer: Self-pay | Admitting: Obstetrics and Gynecology

## 2019-04-28 ENCOUNTER — Inpatient Hospital Stay (HOSPITAL_COMMUNITY)
Admission: AD | Admit: 2019-04-28 | Discharge: 2019-04-28 | Disposition: A | Discharge status: Home / Self Care | Payer: BC Managed Care – PPO | Attending: Obstetrics and Gynecology | Admitting: Obstetrics and Gynecology

## 2019-04-28 ENCOUNTER — Other Ambulatory Visit: Payer: Self-pay

## 2019-04-28 DIAGNOSIS — Z3A38 38 weeks gestation of pregnancy: Secondary | ICD-10-CM | POA: Diagnosis not present

## 2019-04-28 DIAGNOSIS — O471 False labor at or after 37 completed weeks of gestation: Secondary | ICD-10-CM | POA: Insufficient documentation

## 2019-04-28 DIAGNOSIS — N858 Other specified noninflammatory disorders of uterus: Secondary | ICD-10-CM

## 2019-04-28 DIAGNOSIS — Z3A37 37 weeks gestation of pregnancy: Secondary | ICD-10-CM | POA: Diagnosis not present

## 2019-04-28 DIAGNOSIS — Z3689 Encounter for other specified antenatal screening: Secondary | ICD-10-CM

## 2019-04-28 DIAGNOSIS — N859 Noninflammatory disorder of uterus, unspecified: Secondary | ICD-10-CM

## 2019-04-28 NOTE — MAU Provider Note (Signed)
S: Ms. Kimberly Strickland is a 29 y.o. G3P1001 at [redacted]w[redacted]d  who presents to MAU today for labor evaluation.     Cervical exam by RN:  Dilation: 4 Effacement (%): 70 Cervical Position: Posterior Station: -2 Presentation: Vertex Exam by:: benji Forensic scientist  Fetal Monitoring: Baseline: 140 Variability: average Accelerations: present Decelerations: absent Contractions: irregular  MDM Discussed patient with RN. NST reviewed.   A: SIUP at [redacted]w[redacted]d  False labor  P: Discharge home Labor precautions and kick counts included in AVS Patient to follow-up with office as scheduled   Dr Vincente Poli Patient may return to MAU as needed or when in labor   Valora Piccolo 04/30/2019 3:19 AM

## 2019-04-28 NOTE — MAU Note (Addendum)
I have communicated with Wynelle Bourgeois CNM and reviewed vital signs:  Vitals:   04/28/19 2222 04/28/19 2245  BP: 103/71 112/77  Pulse: 97 96  Resp: 18 16  Temp: 98.7 F (37.1 C)     Vaginal exam:   ,   Also reviewed contraction pattern and that non-stress test is reactive.  It has been documented that patient is having  occas Uterine Irritability with  Monitoring  over 40 min not indicating active labor.  Patient prefers to be discharged as her husband and baby are in the car waiting.  Denies any pain.  Patient denies any other complaints.  Based on this report provider has given order for discharge.  A discharge order and diagnosis entered by a provider.   Labor discharge instructions reviewed with patient.

## 2019-04-28 NOTE — Discharge Instructions (Signed)

## 2019-04-28 NOTE — MAU Note (Signed)
Contractions on and off all week.  Was 4 cm on Monday at office.  No bleeding.  Mucus discharge, brown in color. Baby moving well. No leaking.

## 2019-05-03 ENCOUNTER — Inpatient Hospital Stay (HOSPITAL_COMMUNITY)
Admission: AD | Admit: 2019-05-03 | Discharge: 2019-05-03 | Disposition: A | Discharge status: Home / Self Care | Payer: BC Managed Care – PPO | Attending: Obstetrics and Gynecology | Admitting: Obstetrics and Gynecology

## 2019-05-03 ENCOUNTER — Other Ambulatory Visit: Payer: Self-pay

## 2019-05-03 ENCOUNTER — Encounter (HOSPITAL_COMMUNITY): Payer: Self-pay | Admitting: Obstetrics and Gynecology

## 2019-05-03 DIAGNOSIS — Z3A38 38 weeks gestation of pregnancy: Secondary | ICD-10-CM | POA: Insufficient documentation

## 2019-05-03 DIAGNOSIS — O471 False labor at or after 37 completed weeks of gestation: Secondary | ICD-10-CM

## 2019-05-03 DIAGNOSIS — O479 False labor, unspecified: Secondary | ICD-10-CM

## 2019-05-03 NOTE — Discharge Instructions (Signed)
Fetal Movement Counts Patient Name: ________________________________________________ Patient Due Date: ____________________ What is a fetal movement count?  A fetal movement count is the number of times that you feel your baby move during a certain amount of time. This may also be called a fetal kick count. A fetal movement count is recommended for every pregnant woman. You may be asked to start counting fetal movements as early as week 28 of your pregnancy. Pay attention to when your baby is most active. You may notice your baby's sleep and wake cycles. You may also notice things that make your baby move more. You should do a fetal movement count:  When your baby is normally most active.  At the same time each day. A good time to count movements is while you are resting, after having something to eat and drink. How do I count fetal movements? 1. Find a quiet, comfortable area. Sit, or lie down on your side. 2. Write down the date, the start time and stop time, and the number of movements that you felt between those two times. Take this information with you to your health care visits. 3. Write down your start time when you feel the first movement. 4. Count kicks, flutters, swishes, rolls, and jabs. You should feel at least 10 movements. 5. You may stop counting after you have felt 10 movements, or if you have been counting for 2 hours. Write down the stop time. 6. If you do not feel 10 movements in 2 hours, contact your health care provider for further instructions. Your health care provider may want to do additional tests to assess your baby's well-being. Contact a health care provider if:  You feel fewer than 10 movements in 2 hours.  Your baby is not moving like he or she usually does. Date: ____________ Start time: ____________ Stop time: ____________ Movements: ____________ Date: ____________ Start time: ____________ Stop time: ____________ Movements: ____________ Date: ____________  Start time: ____________ Stop time: ____________ Movements: ____________ Date: ____________ Start time: ____________ Stop time: ____________ Movements: ____________ Date: ____________ Start time: ____________ Stop time: ____________ Movements: ____________ Date: ____________ Start time: ____________ Stop time: ____________ Movements: ____________ Date: ____________ Start time: ____________ Stop time: ____________ Movements: ____________ Date: ____________ Start time: ____________ Stop time: ____________ Movements: ____________ Date: ____________ Start time: ____________ Stop time: ____________ Movements: ____________ This information is not intended to replace advice given to you by your health care provider. Make sure you discuss any questions you have with your health care provider. Document Revised: 11/12/2018 Document Reviewed: 11/12/2018 Elsevier Patient Education  2020 [employer]        Signs and Symptoms of Labor Labor is your body's natural process of moving your baby, placenta, and umbilical cord out of your uterus. The process of labor usually starts when your baby is full-term, between 37 and 40 weeks of pregnancy. How will I know when I am close to going into labor? As your body prepares for labor and the birth of your baby, you may notice the following symptoms in the weeks and days before true labor starts:  Having a strong desire to get your home ready to receive your new baby. This is called nesting. Nesting may be a sign that labor is approaching, and it may occur several weeks before birth. Nesting may involve cleaning and organizing your home.  Passing a small amount of thick, bloody mucus out of your vagina (normal bloody show or losing your mucus plug). This may happen more than a   week before labor begins, or it might occur right before labor begins as the opening of the cervix starts to widen (dilate). For some women, the entire mucus plug passes at once. For others,  smaller portions of the mucus plug may gradually pass over several days.  Your baby moving (dropping) lower in your pelvis to get into position for birth (lightening). When this happens, you may feel more pressure on your bladder and pelvic bone and less pressure on your ribs. This may make it easier to breathe. It may also cause you to need to urinate more often and have problems with bowel movements.  Having "practice contractions" (Braxton Hicks contractions) that occur at irregular (unevenly spaced) intervals that are more than 10 minutes apart. This is also called false labor. False labor contractions are common after exercise or sexual activity, and they will stop if you change position, rest, or drink fluids. These contractions are usually mild and do not get stronger over time. They may feel like: ? A backache or back pain. ? Mild cramps, similar to menstrual cramps. ? Tightening or pressure in your abdomen. Other early symptoms that labor may be starting soon include:  Nausea or loss of appetite.  Diarrhea.  Having a sudden burst of energy, or feeling very tired.  Mood changes.  Having trouble sleeping. How will I know when labor has begun? Signs that true labor has begun may include:  Having contractions that come at regular (evenly spaced) intervals and increase in intensity. This may feel like more intense tightening or pressure in your abdomen that moves to your back. ? Contractions may also feel like rhythmic pain in your upper thighs or back that comes and goes at regular intervals. ? For first-time mothers, this change in intensity of contractions often occurs at a more gradual pace. ? Women who have given birth before may notice a more rapid progression of contraction changes.  Having a feeling of pressure in the vaginal area.  Your water breaking (rupture of membranes). This is when the sac of fluid that surrounds your baby breaks. When this happens, you will notice  fluid leaking from your vagina. This may be clear or blood-tinged. Labor usually starts within 24 hours of your water breaking, but it may take longer to begin. ? Some women notice this as a gush of fluid. ? Others notice that their underwear repeatedly becomes damp. Follow these instructions at home:   When labor starts, or if your water breaks, call your health care provider or nurse care line. Based on your situation, they will determine when you should go in for an exam.  When you are in early labor, you may be able to rest and manage symptoms at home. Some strategies to try at home include: ? Breathing and relaxation techniques. ? Taking a warm bath or shower. ? Listening to music. ? Using a heating pad on the lower back for pain. If you are directed to use heat:  Place a towel between your skin and the heat source.  Leave the heat on for 20-30 minutes.  Remove the heat if your skin turns bright red. This is especially important if you are unable to feel pain, heat, or cold. You may have a greater risk of getting burned. Get help right away if:  You have painful, regular contractions that are 5 minutes apart or less.  Labor starts before you are [redacted] weeks along in your pregnancy.  You have a fever.  You have   a headache that does not go away.  You have bright red blood coming from your vagina.  You do not feel your baby moving.  You have a sudden onset of: ? Severe headache with vision problems. ? Nausea, vomiting, or diarrhea. ? Chest pain or shortness of breath. These symptoms may be an emergency. If your health care provider recommends that you go to the hospital or birth center where you plan to deliver, do not drive yourself. Have someone else drive you, or call emergency services (911 in the [place] Summary  Labor is your body's natural process of moving your baby, placenta, and umbilical cord out of your uterus.  The process of labor usually starts when your baby is  full-term, between 37 and 40 weeks of pregnancy.  When labor starts, or if your water breaks, call your health care provider or nurse care line. Based on your situation, they will determine when you should go in for an exam. This information is not intended to replace advice given to you by your health care provider. Make sure you discuss any questions you have with your health care provider. Document Revised: 12/23/2016 Document Reviewed: 08/30/2016 Elsevier Patient Education  2020 Elsevier Inc.  

## 2019-05-03 NOTE — MAU Provider Note (Signed)
S: Patient is here for RN labor evaluation. Strip, vital signs, & chart Reviewed   O:  Vitals:   05/03/19 0915 05/03/19 0924 05/03/19 0926 05/03/19 0935  BP: 116/66  106/62   Pulse: 88  (!) 128   Resp: 16     Temp:  98.5 F (36.9 C)    TempSrc:  Oral    SpO2: 100%   98%  Weight:      Height: 5\' 7"  (1.702 m)      No results found for this or any previous visit (from the past 24 hour(s)).  Dilation: 4 Effacement (%): 60 Cervical Position: Middle Station: -2 Presentation: Vertex Exam by:: T Lytle RNC   FHR: 135 bpm, Mod Var, no Decels, 15x15 Accels UC: irregular   A: 1. False labor   2. [redacted] weeks gestation of pregnancy      P:  RN to discharge home in stable condition with return precautions & fetal kick counts  [phone] FNP 11:05 AM

## 2019-05-03 NOTE — MAU Note (Signed)
Pt is here for labor eval w/cramping and is unsure if laboring. She has cramping every 8-10 mins since 0500. Denies LOF,  VB. +FM

## 2019-05-03 NOTE — MAU Note (Signed)
I have communicated with Michael Boston, NP and reviewed vital signs:  Vitals:   05/03/19 0935 05/03/19 1105  BP:  118/74  Pulse:  94  Resp:    Temp:    SpO2: 98%     Vaginal exam:  Dilation: 4 Effacement (%): 60 Cervical Position: Middle Station: -2 Presentation: Vertex Exam by:: T Anyiah Coverdale RNC,   Also reviewed contraction pattern and that non-stress test is reactive.  It has been documented that patient is contracting every irregularly minutes with no cervical change over 1 hour not indicating active labor.  Patient denies any other complaints.  Based on this report provider has given order for discharge.  A discharge order and diagnosis entered by a provider.   Labor discharge instructions reviewed with patient.

## 2019-05-06 ENCOUNTER — Telehealth (HOSPITAL_COMMUNITY): Payer: Self-pay | Admitting: *Deleted

## 2019-05-06 ENCOUNTER — Encounter (HOSPITAL_COMMUNITY): Payer: Self-pay | Admitting: *Deleted

## 2019-05-06 NOTE — Telephone Encounter (Signed)
Preadmission screen  

## 2019-05-07 NOTE — H&P (Signed)
Kimberly Strickland is a 29 y.o. female presenting for IOL at term. Pregnancy complicated by Hx of MS-no meds currently, Hx of IUFT and cervical incompetence-cerclage this pregnancy removed 04/19/19, patient and FOB both carriers for SMA-genetics consult declined. OB History    Gravida  3   Para  2   Term  1   Preterm      AB  0   Living  1     SAB  0   TAB      Ectopic      Multiple  0   Live Births  1          Past Medical History:  Diagnosis Date  . Anemia    with pregnancy  . Family history of adverse reaction to anesthesia    mother  "drops her heart rate"  . History of abnormal cervical Pap smear   . History of incompetent cervix, currently pregnant   . MS (multiple sclerosis) [facility])    neurologist-- dr Youlanda Mighty @ [facility]---  relapsing type,  (11-13-2018 per pt currently no treatment due to pregnancy)  . Occipital neuralgia   . SMA (spinal muscular atrophy) (HCC)    Past Surgical History:  Procedure Laterality Date  . CERVICAL CERCLAGE N/A 11/17/2018   Procedure: CERCLAGE CERVICAL;  Surgeon: Mitchel Honour, DO;  Location: [facility] [place];  Service: Gynecology;  Laterality: N/A;  . ESOPHAGOGASTRODUODENOSCOPY (EGD) WITH PROPOFOL  2017  approx.   Family History: family history includes Breast cancer in her maternal aunt; Thyroid disease in her mother. Social History:  reports that she quit smoking about 7 years ago. Her smoking use included cigars. She has never used smokeless tobacco. She reports previous alcohol use. She reports previous drug use. Drug: Marijuana.     Maternal Diabetes: No Genetic Screening: Normal Maternal Ultrasounds/Referrals: Normal Fetal Ultrasounds or other Referrals:  None Maternal Substance Abuse:  No Significant Maternal Medications:  None Significant Maternal Lab Results:  Group B Strep negative Other Comments:  None  Review of Systems  Gastrointestinal: Negative for abdominal pain.  Neurological: Negative  for headaches.   Maternal Medical History:  Fetal activity: Perceived fetal activity is normal.        Last menstrual period 08/02/2018, not currently breastfeeding. Maternal Exam:  Abdomen: Fetal presentation: vertex     Physical Exam  Cardiovascular: Normal rate.  Respiratory: Effort normal.  GI: Soft.    Cx 5/50/-3 in office 05/04/19  Prenatal labs: ABO, Rh: O/Positive/-- (06/30 0000) Antibody: Negative (06/30 0000) Rubella: Immune (06/30 0000) RPR: Nonreactive (06/30 0000)  HBsAg: Negative (06/30 0000)  HIV: Non-reactive (06/30 0000)  GBS: Negative/-- (01/11 0000)  GBS negative 04/19/19  Assessment/Plan: 29 yo G3P1 @ term with favorable cervix for IOL   Roselle Locus II 05/07/2019, 10:20 AM

## 2019-05-08 ENCOUNTER — Other Ambulatory Visit (HOSPITAL_COMMUNITY)
Admission: RE | Admit: 2019-05-08 | Discharge: 2019-05-08 | Disposition: A | Discharge status: Home / Self Care | Payer: BC Managed Care – PPO | Source: Ambulatory Visit | Attending: Obstetrics and Gynecology | Admitting: Obstetrics and Gynecology

## 2019-05-08 DIAGNOSIS — Z20822 Contact with and (suspected) exposure to covid-19: Secondary | ICD-10-CM | POA: Insufficient documentation

## 2019-05-08 LAB — SARS CORONAVIRUS 2 (TAT 6-24 HRS): SARS Coronavirus 2: NEGATIVE

## 2019-05-10 ENCOUNTER — Other Ambulatory Visit: Payer: Self-pay

## 2019-05-10 ENCOUNTER — Inpatient Hospital Stay (HOSPITAL_COMMUNITY)
Admission: AD | Admit: 2019-05-10 | Discharge: 2019-05-12 | DRG: 806 | Disposition: A | Discharge status: Home / Self Care | Payer: BC Managed Care – PPO | Attending: Obstetrics and Gynecology | Admitting: Obstetrics and Gynecology

## 2019-05-10 ENCOUNTER — Inpatient Hospital Stay (HOSPITAL_COMMUNITY): Payer: BC Managed Care – PPO

## 2019-05-10 ENCOUNTER — Encounter (HOSPITAL_COMMUNITY): Payer: Self-pay | Admitting: Obstetrics and Gynecology

## 2019-05-10 DIAGNOSIS — G35 Multiple sclerosis: Secondary | ICD-10-CM | POA: Diagnosis present

## 2019-05-10 DIAGNOSIS — Z3A39 39 weeks gestation of pregnancy: Secondary | ICD-10-CM | POA: Diagnosis not present

## 2019-05-10 DIAGNOSIS — Z3490 Encounter for supervision of normal pregnancy, unspecified, unspecified trimester: Secondary | ICD-10-CM

## 2019-05-10 DIAGNOSIS — Z87891 Personal history of nicotine dependence: Secondary | ICD-10-CM

## 2019-05-10 DIAGNOSIS — O99354 Diseases of the nervous system complicating childbirth: Secondary | ICD-10-CM | POA: Diagnosis present

## 2019-05-10 DIAGNOSIS — O26893 Other specified pregnancy related conditions, third trimester: Secondary | ICD-10-CM | POA: Diagnosis present

## 2019-05-10 LAB — CBC
HCT: 37.6 % (ref 36.0–46.0)
Hemoglobin: 11.9 g/dL — ABNORMAL LOW (ref 12.0–15.0)
MCH: 32.2 pg (ref 26.0–34.0)
MCHC: 31.6 g/dL (ref 30.0–36.0)
MCV: 101.9 fL — ABNORMAL HIGH (ref 80.0–100.0)
Platelets: 225 10*3/uL (ref 150–400)
RBC: 3.69 MIL/uL — ABNORMAL LOW (ref 3.87–5.11)
RDW: 12.3 % (ref 11.5–15.5)
WBC: 10.2 10*3/uL (ref 4.0–10.5)
nRBC: 0 % (ref 0.0–0.2)

## 2019-05-10 LAB — ABO/RH: ABO/RH(D): O POS

## 2019-05-10 LAB — RPR: RPR Ser Ql: NONREACTIVE

## 2019-05-10 LAB — TYPE AND SCREEN
ABO/RH(D): O POS
Antibody Screen: NEGATIVE

## 2019-05-10 MED ORDER — FLEET ENEMA 7-19 GM/118ML RE ENEM: 1.0000 | ENEMA | RECTAL | Status: DC | PRN

## 2019-05-10 MED ORDER — LACTATED RINGERS IV SOLN: INTRAVENOUS | Status: DC

## 2019-05-10 MED ORDER — LIDOCAINE HCL (PF) 1 % IJ SOLN: 30.0000 mL | INTRAMUSCULAR | Status: DC | PRN

## 2019-05-10 MED ORDER — SIMETHICONE 80 MG PO CHEW: 80.0000 mg | CHEWABLE_TABLET | ORAL | Status: DC | PRN

## 2019-05-10 MED ORDER — OXYCODONE-ACETAMINOPHEN 5-325 MG PO TABS: 1.0000 | ORAL_TABLET | ORAL | Status: DC | PRN

## 2019-05-10 MED ORDER — FENTANYL CITRATE (PF) 100 MCG/2ML IJ SOLN: 50.0000 ug | INTRAMUSCULAR | Status: DC | PRN

## 2019-05-10 MED ORDER — WITCH HAZEL-GLYCERIN EX PADS: 1.0000 "application " | MEDICATED_PAD | CUTANEOUS | Status: DC | PRN

## 2019-05-10 MED ORDER — OXYCODONE HCL 5 MG PO TABS: 10.0000 mg | ORAL_TABLET | ORAL | Status: DC | PRN

## 2019-05-10 MED ORDER — ONDANSETRON HCL 4 MG/2ML IJ SOLN: 4.0000 mg | Freq: Four times a day (QID) | INTRAMUSCULAR | Status: DC | PRN

## 2019-05-10 MED ORDER — PRENATAL MULTIVITAMIN CH
1.0000 | ORAL_TABLET | Freq: Every day | ORAL | Status: DC
  Administered 2019-05-11 – 2019-05-12 (×2): 1 via ORAL
  Filled 2019-05-10 (×2): qty 1

## 2019-05-10 MED ORDER — TETANUS-DIPHTH-ACELL PERTUSSIS 5-2.5-18.5 LF-MCG/0.5 IM SUSP: 0.5000 mL | Freq: Once | INTRAMUSCULAR | Status: DC

## 2019-05-10 MED ORDER — ACETAMINOPHEN 325 MG PO TABS: 650.0000 mg | ORAL_TABLET | ORAL | Status: DC | PRN

## 2019-05-10 MED ORDER — ZOLPIDEM TARTRATE 5 MG PO TABS: 5.0000 mg | ORAL_TABLET | Freq: Every evening | ORAL | Status: DC | PRN

## 2019-05-10 MED ORDER — DIPHENHYDRAMINE HCL 25 MG PO CAPS: 25.0000 mg | ORAL_CAPSULE | Freq: Four times a day (QID) | ORAL | Status: DC | PRN

## 2019-05-10 MED ORDER — OXYCODONE-ACETAMINOPHEN 5-325 MG PO TABS: 2.0000 | ORAL_TABLET | ORAL | Status: DC | PRN

## 2019-05-10 MED ORDER — ONDANSETRON HCL 4 MG PO TABS: 4.0000 mg | ORAL_TABLET | ORAL | Status: DC | PRN

## 2019-05-10 MED ORDER — BENZOCAINE-MENTHOL 20-0.5 % EX AERO
1.0000 "application " | INHALATION_SPRAY | CUTANEOUS | Status: DC | PRN
  Administered 2019-05-10: 1 via TOPICAL
  Filled 2019-05-10: qty 56

## 2019-05-10 MED ORDER — IBUPROFEN 600 MG PO TABS
600.0000 mg | ORAL_TABLET | Freq: Four times a day (QID) | ORAL | Status: DC
  Administered 2019-05-10 – 2019-05-12 (×8): 600 mg via ORAL
  Filled 2019-05-10 (×8): qty 1

## 2019-05-10 MED ORDER — SOD CITRATE-CITRIC ACID 500-334 MG/5ML PO SOLN: 30.0000 mL | ORAL | Status: DC | PRN

## 2019-05-10 MED ORDER — SENNOSIDES-DOCUSATE SODIUM 8.6-50 MG PO TABS
2.0000 | ORAL_TABLET | ORAL | Status: DC
  Administered 2019-05-11 – 2019-05-12 (×2): 2 via ORAL
  Filled 2019-05-10 (×2): qty 2

## 2019-05-10 MED ORDER — ONDANSETRON HCL 4 MG/2ML IJ SOLN: 4.0000 mg | INTRAMUSCULAR | Status: DC | PRN

## 2019-05-10 MED ORDER — DIBUCAINE (PERIANAL) 1 % EX OINT: 1.0000 "application " | TOPICAL_OINTMENT | CUTANEOUS | Status: DC | PRN

## 2019-05-10 MED ORDER — OXYTOCIN 40 UNITS IN NORMAL SALINE INFUSION - SIMPLE MED
2.5000 [IU]/h | INTRAVENOUS | Status: DC
  Filled 2019-05-10: qty 1000

## 2019-05-10 MED ORDER — ACETAMINOPHEN 325 MG PO TABS
650.0000 mg | ORAL_TABLET | ORAL | Status: DC | PRN
  Administered 2019-05-10 – 2019-05-11 (×3): 650 mg via ORAL
  Filled 2019-05-10 (×3): qty 2

## 2019-05-10 MED ORDER — LACTATED RINGERS IV SOLN: 500.0000 mL | INTRAVENOUS | Status: DC | PRN

## 2019-05-10 MED ORDER — OXYTOCIN BOLUS FROM INFUSION
500.0000 mL | Freq: Once | INTRAVENOUS | Status: AC
  Administered 2019-05-10: 500 mL via INTRAVENOUS

## 2019-05-10 MED ORDER — OXYCODONE HCL 5 MG PO TABS: 5.0000 mg | ORAL_TABLET | ORAL | Status: DC | PRN

## 2019-05-10 MED ORDER — COCONUT OIL OIL
1.0000 "application " | TOPICAL_OIL | Status: DC | PRN
  Administered 2019-05-10: 1 via TOPICAL

## 2019-05-10 NOTE — Lactation Note (Signed)
This note was copied from a baby's chart. Lactation Consultation Note  Patient Name: Kimberly Strickland WCHEN'I Date: 05/10/2019 Reason for consult: Initial assessment;Term  P2 mother whose infant is now 41 hours old.  Mother breast fed her first child (now 20 months) for 6 months.  Baby was swaddled and asleep in mother's arms when I arrived.  Mother stated that she has latched twice since delivery.  Mother re-told her birth story and was happy to inform me that labor was quick and she did not require any pain medication.    Encouraged to feed 8-12 times/24 hours or sooner if baby shows feeding cues.  Reviewed cues.  Taught mother hand expression and colostrum container left at bedside.  Mother will save any EBM she obtains and finger feed colostrum back to baby.  Encouraged mother to call her RN/LC for latch assistance as needed.  Mom made aware of O/P services, breastfeeding support groups, community resources, and our phone # for post-discharge questions. Mother has a DEBP for home use but is also planning on obtaining a new one from her current insurance company.  Father present and on the phone during my visit.   Maternal Data Formula Feeding for Exclusion: No Has patient been taught Hand Expression?: Yes Does the patient have breastfeeding experience prior to this delivery?: Yes  Feeding Feeding Type: Breast Fed  LATCH Score                   Interventions    Lactation Tools Discussed/Used     Consult Status Consult Status: Follow-up Date: 05/11/19 Follow-up type: In-patient    [name] 05/10/2019, 5:38 PM

## 2019-05-10 NOTE — Progress Notes (Signed)
Delivery Note At 12:48 PM a viable female was delivered via Vaginal, Spontaneous (Presentation:OP>rotation with pushing>OA      ).  APGAR: , ; weight  .   Placenta status: Expressed, Intact.  Cord: 3 vessels with the following complications: loose nuchal cord x 1 None.  Cord pH:   Rapid second stage   Anesthesia: None Episiotomy:   Lacerations:   Suture Repair:  Est. Blood Loss (mL):    Mom to postpartum.  Baby to Couplet care / Skin to Skin.  Roselle Locus II 05/10/2019, 12:59 PM

## 2019-05-10 NOTE — Progress Notes (Signed)
No changes to H&P per patient history FHT cat one UCs irregular Cx 5/80/-1/vtx AROM clear

## 2019-05-10 NOTE — Lactation Note (Signed)
This note was copied from a baby's chart. Lactation Consultation Note  Patient Name: Kimberly Strickland IRSWN'I Date: 05/10/2019   1523 LC attempted to see mom but mom is getting settled into M/B unit and desires to eat lunch. LC to return later.  Virgia Land 05/10/2019, 3:24 PM

## 2019-05-11 LAB — CBC
HCT: 35.4 % — ABNORMAL LOW (ref 36.0–46.0)
Hemoglobin: 11.5 g/dL — ABNORMAL LOW (ref 12.0–15.0)
MCH: 33.1 pg (ref 26.0–34.0)
MCHC: 32.5 g/dL (ref 30.0–36.0)
MCV: 102 fL — ABNORMAL HIGH (ref 80.0–100.0)
Platelets: 211 10*3/uL (ref 150–400)
RBC: 3.47 MIL/uL — ABNORMAL LOW (ref 3.87–5.11)
RDW: 12.5 % (ref 11.5–15.5)
WBC: 13.6 10*3/uL — ABNORMAL HIGH (ref 4.0–10.5)
nRBC: 0.1 % (ref 0.0–0.2)

## 2019-05-11 NOTE — Progress Notes (Signed)
Post Partum Day 1 Subjective: no complaints, up ad lib, voiding and tolerating PO  Objective: Blood pressure 114/74, pulse 73, temperature 97.6 F (36.4 C), temperature source Oral, resp. rate 18, last menstrual period 08/02/2018, SpO2 97 %, unknown if currently breastfeeding.  Physical Exam:  General: alert, cooperative and appears stated age Lochia: appropriate Uterine Fundus: firm Incision: n/a DVT Evaluation: No evidence of DVT seen on physical exam. Negative Homan's sign. No cords or calf tenderness.  Recent Labs    05/10/19 0820 05/11/19 0501  HGB 11.9* 11.5*  HCT 37.6 35.4*    Assessment/Plan: Plan for discharge tomorrow   LOS: 1 day   Mitchel Honour 05/11/2019, 9:17 AM

## 2019-05-12 NOTE — Discharge Summary (Signed)
Obstetric Discharge Summary Reason for Admission: induction of labor Prenatal Procedures: none Intrapartum Procedures: spontaneous vaginal delivery Postpartum Procedures: none Complications-Operative and Postpartum: none Hemoglobin  Date Value Ref Range Status  05/11/2019 11.5 (L) 12.0 - 15.0 g/dL Final   HCT  Date Value Ref Range Status  05/11/2019 35.4 (L) 36.0 - 46.0 % Final    Physical Exam:  General: alert and cooperative Lochia: appropriate Uterine Fundus: firm Incision: N/A DVT Evaluation: No evidence of DVT seen on physical exam.  Discharge Diagnoses: Term Pregnancy-delivered  Discharge Information: Date: 05/12/2019 Activity: pelvic rest Diet: routine Medications: PNV Condition: stable Instructions: refer to practice specific booklet Discharge to: home   Newborn Data: Live born female  Birth Weight: 7 lb 14.1 oz (3575 g) APGAR: 9, 9  Newborn Delivery   Birth date/time: 05/10/2019 12:48:00 Delivery type: Vaginal, Spontaneous      Home with mother.  Kimberly Strickland 05/12/2019, 8:16 AM

## 2019-05-12 NOTE — Lactation Note (Addendum)
This note was copied from a baby's chart. Lactation Consultation Note  Patient Name: Kimberly Strickland AOZHY'Q Date: 05/12/2019 Reason for consult: Follow-up assessment;Term;Infant weight loss Baby is 45 hours old  LC reviewed and updated the doc flow sheets with mom.  Per mom baby's last feeding was at 5 :30 am and its 10 am.  LC recommended to check the diaper, change if needed and latch.  LC offered to assist with latch due to mom mentioning nipples are tender.  No breakdown noted and worked on proper support and depth and baby latched well and was still feeding at 10 mins with multiple swallows. Per mom comfortable.  Prior to the feeding mom hand expressed and spoon fed the baby. Baby had a stool too.  Sore nipple and engorgement prevention and tx reviewed/ and storage of breast milk.  Mom already had been given a hand pump and per mom tends to like the hand pump.  Per mom has a DEBP.  Mom mentioned she is not sure when she will have to go back on her MS meds and  Plans to do extra pumping besides breast feeding so her baby will receive EBM when she is on her meds and wean the baby from breast feeding.  LC recommended post pumping after feeding when she is the fullest and not when the baby is cluster feeding. After 3 feedings a day.  Mom has the [facility] pamphlet with phone numbers.    MBURN Heather Price aware to update the total time for the feeding.    Maternal Data Has patient been taught Hand Expression?: Yes  Feeding Feeding Type: Breast Milk  LATCH Score Latch: Grasps breast easily, tongue down, lips flanged, rhythmical sucking.  Audible Swallowing: Spontaneous and intermittent  Type of Nipple: Everted at rest and after stimulation  Comfort (Breast/Nipple): Filling, red/small blisters or bruises, mild/mod discomfort  Hold (Positioning): Assistance needed to correctly position infant at breast and maintain latch.  LATCH Score: 8  Interventions Interventions: Breast  feeding basics reviewed;Assisted with latch;Skin to skin;Breast massage;Breast compression;Adjust position;Support pillows;Position options;Hand pump  Lactation Tools Discussed/Used Pump Review: Milk Storage   Consult Status Consult Status: Complete Date: 05/12/19    Kathrin Greathouse 05/12/2019, 10:11 AM

## 2019-06-28 ENCOUNTER — Emergency Department (HOSPITAL_COMMUNITY)
Admission: EM | Admit: 2019-06-28 | Discharge: 2019-06-28 | Discharge status: Against Medical Advice | Payer: BC Managed Care – PPO

## 2019-06-28 NOTE — ED Notes (Signed)
Name called for triage, no response

## 2019-06-28 NOTE — ED Notes (Signed)
Called for triage, no answer

## 2019-11-09 ENCOUNTER — Ambulatory Visit: Payer: BC Managed Care – PPO | Admitting: Physical Therapy

## 2019-11-16 ENCOUNTER — Ambulatory Visit: Payer: Medicaid Other | Admitting: Physical Therapy

## 2019-11-18 ENCOUNTER — Other Ambulatory Visit: Payer: Self-pay | Admitting: Physician Assistant

## 2019-11-18 DIAGNOSIS — G35 Multiple sclerosis: Secondary | ICD-10-CM

## 2019-11-23 ENCOUNTER — Encounter: Payer: BC Managed Care – PPO | Admitting: Physical Therapy

## 2019-11-30 ENCOUNTER — Encounter: Payer: BC Managed Care – PPO | Admitting: Physical Therapy

## 2019-12-11 ENCOUNTER — Ambulatory Visit
Admission: RE | Admit: 2019-12-11 | Discharge: 2019-12-11 | Disposition: A | Discharge status: Home / Self Care | Payer: Medicaid Other | Source: Ambulatory Visit | Attending: Physician Assistant | Admitting: Physician Assistant

## 2019-12-11 ENCOUNTER — Other Ambulatory Visit: Payer: Self-pay

## 2019-12-11 DIAGNOSIS — G35 Multiple sclerosis: Secondary | ICD-10-CM

## 2019-12-11 IMAGING — MR MR HEAD WO/W CM
12 series · 48 of 48 positions shown · IV contrast (multihance)
Comparison: None.

CLINICAL DATA: Multiple sclerosis.

EXAM:
MRI HEAD WITHOUT AND WITH CONTRAST
MRI CERVICAL SPINE WITHOUT AND WITH CONTRAST
TECHNIQUE: Multiplanar, multiecho pulse sequences of the brain and surrounding
structures, and cervical spine, to include the craniocervical
junction and cervicothoracic junction, were obtained without and
with intravenous contrast.
CONTRAST:  20mL MULTIHANCE GADOBENATE DIMEGLUMINE 529 MG/ML IV SOLN

[Series 5: T1 · sagittal · 4.0mm · 0.75mm/px · 1 of 31 slices shown (1 of 3)]
[im 1/31]
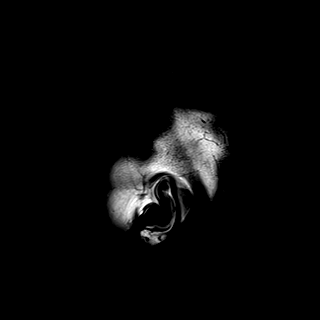

[Series 6: DWI · axial · 3.0mm · 1.44mm/px · z∈[-56,+82]mm · 4 of 86 slices shown (1 of 4)]
[im 1/86]
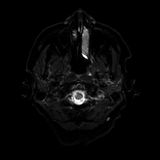
[im 29/86]
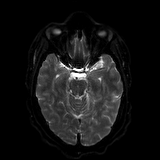
[im 57/86]
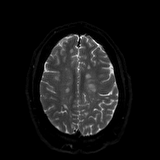
[im 86/86]
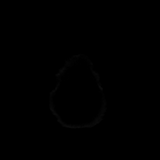

[Series 7: DWI · axial · 3.0mm · 1.44mm/px · z∈[-56,+82]mm · 3 of 43 slices shown (2 of 4)]
[im 1/43]
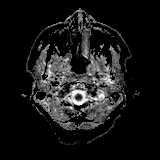
[im 22/43]
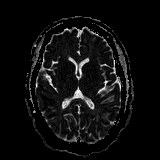
[im 43/43]
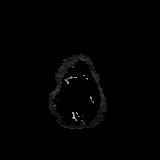

[Series 8: DWI · coronal · 5.0mm · 1.44mm/px · 4 of 64 slices shown (3 of 4)]
[im 1/64]
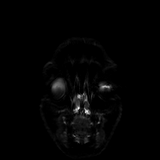
[im 22/64]
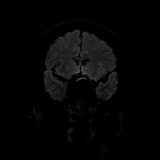
[im 43/64]
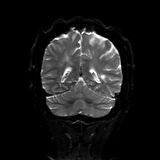
[im 64/64]
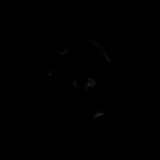

[Series 9: DWI · coronal · 5.0mm · 1.44mm/px · 2 of 32 slices shown (4 of 4)]
[im 1/32]
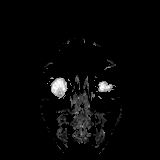
[im 32/32]
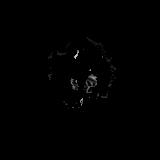

[Series 10: T2 · axial · 4.0mm · 0.36mm/px · z∈[-59,+81]mm · 2 of 28 slices shown (1 of 2)]
[im 1/28]
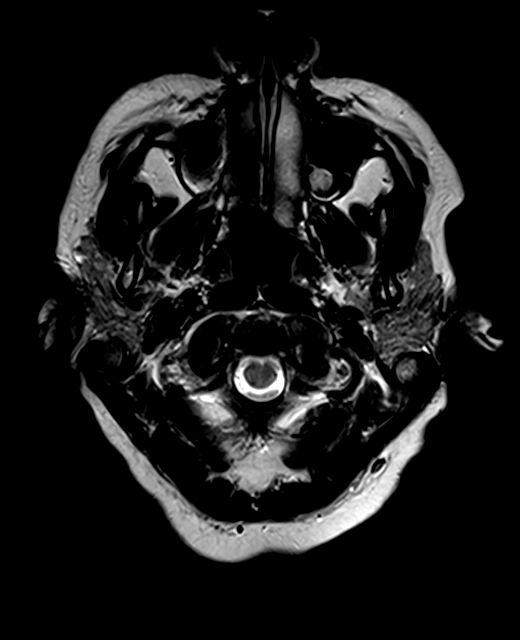
[im 28/28]
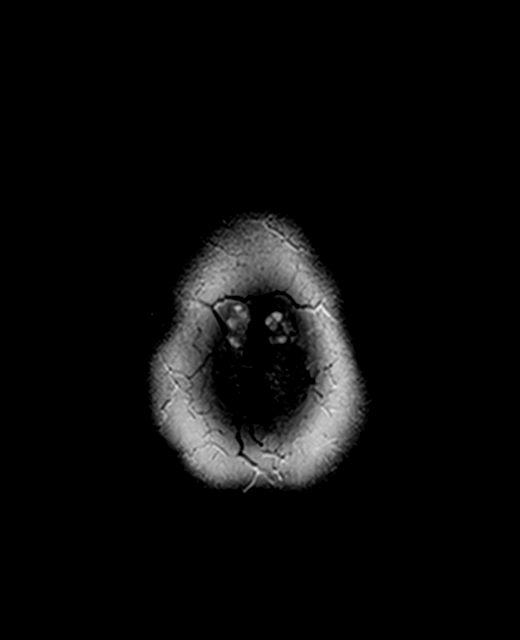

[Series 11: FLAIR · axial · 3.0mm · 0.72mm/px · z∈[-64,+85]mm · 2 of 26 slices shown]
[im 1/26]
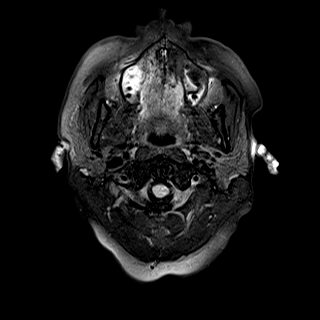
[im 26/26]
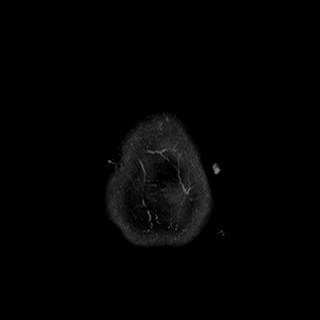

[Series 13: swi_images · axial · 1.5mm · 0.90mm/px · z∈[-60,+82]mm · 6 of 96 slices shown]
[im 1/96]
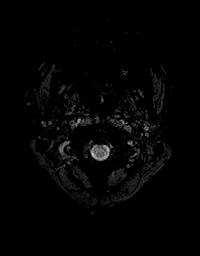
[im 20/96]
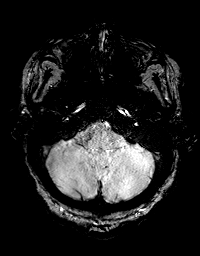
[im 39/96]
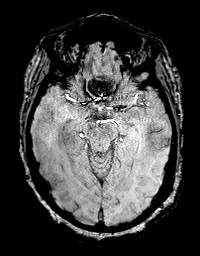
[im 58/96]
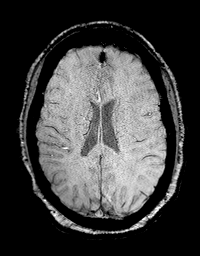
[im 77/96]
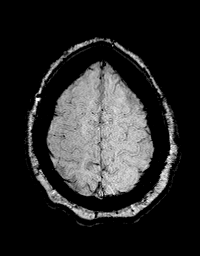
[im 96/96]
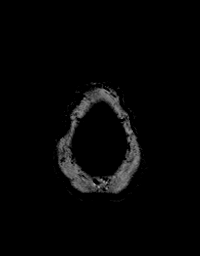

[Series 14: T1 · axial · 1.0mm · 0.94mm/px · z∈[-78,+81]mm · 10 of 160 slices shown (2 of 3)]
[im 1/160]
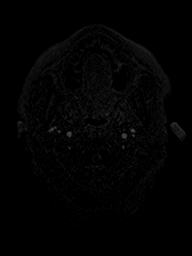
[im 18/160]
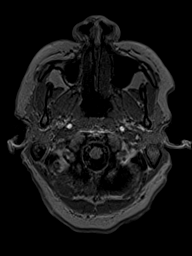
[im 36/160]
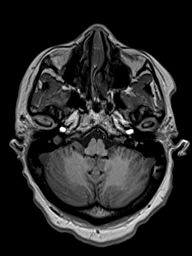
[im 54/160]
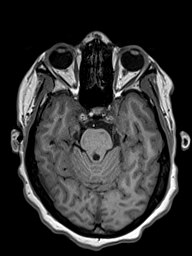
[im 71/160]
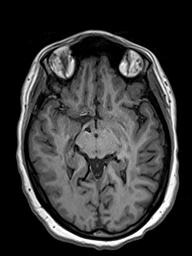
[im 89/160]
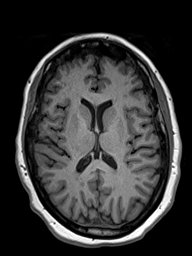
[im 107/160]
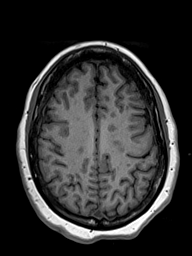
[im 124/160]
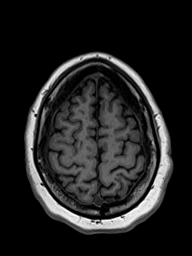
[im 142/160]
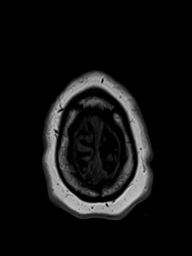
[im 160/160]
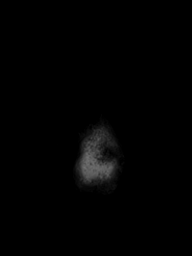

[Series 15: T2 · coronal · 4.5mm · 0.36mm/px · 2 of 32 slices shown (2 of 2)]
[im 1/32]
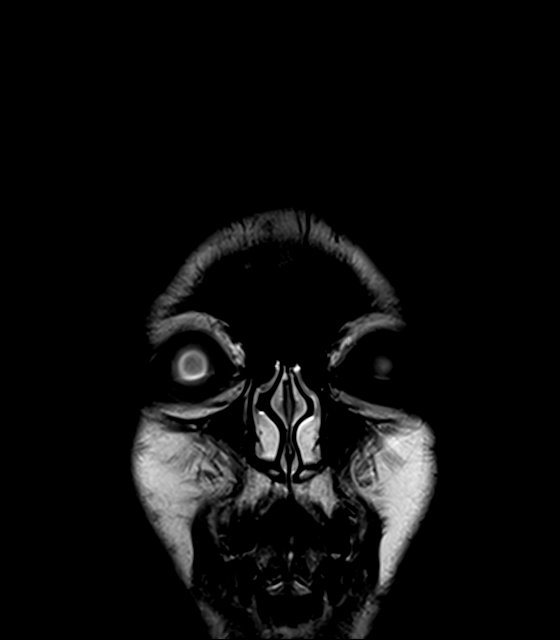
[im 32/32]
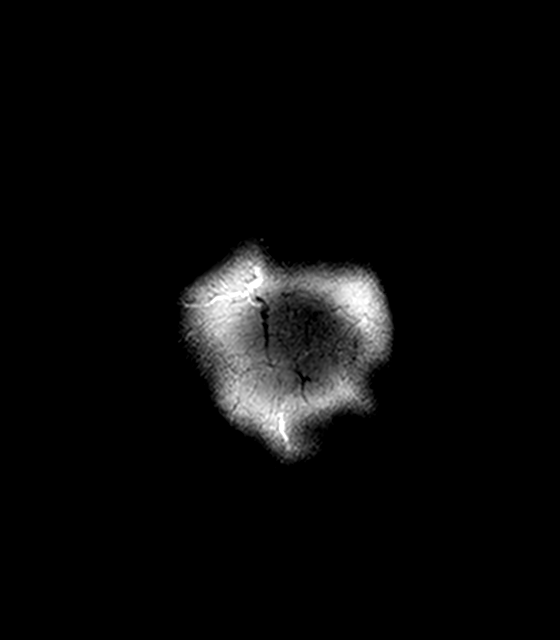

[Series 29: T1 · axial · 1.0mm · 0.94mm/px · z∈[-78,+81]mm · 10 of 160 slices shown (3 of 3)]
[im 1/160]
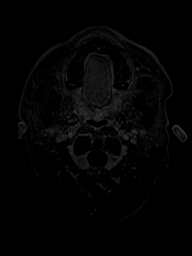
[im 18/160]
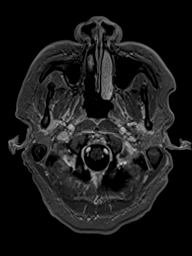
[im 36/160]
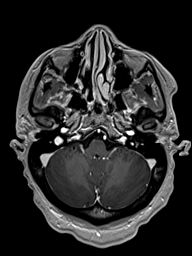
[im 54/160]
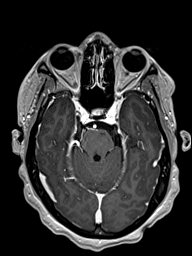
[im 71/160]
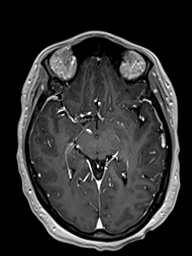
[im 89/160]
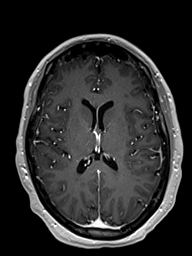
[im 107/160]
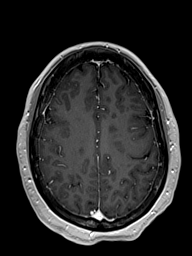
[im 124/160]
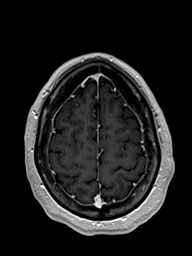
[im 142/160]
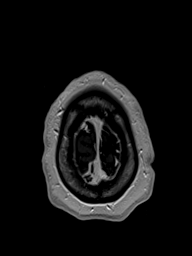
[im 160/160]
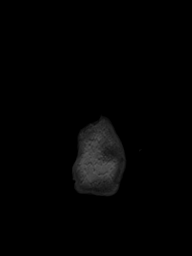

[Series 30: T1 post-contrast · coronal · 4.0mm · 0.72mm/px · 2 of 33 slices shown]
[im 1/33]
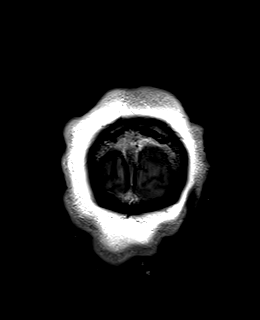
[im 33/33]
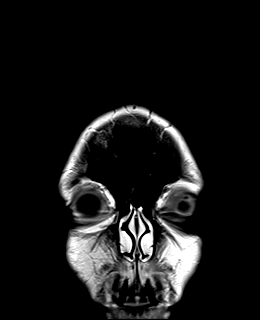

[48 of 48 positions shown; findings below may reference images not displayed]

FINDINGS: MRI HEAD FINDINGS

Brain: There is no evidence of an acute infarct, intracranial
hemorrhage, mass, midline shift, or extra-axial fluid collection.
The ventricles and sulci are normal. There is extensive cerebral
white matter disease with numerous T2 hyperintense lesions
throughout the juxtacortical, deep, and periventricular white matter
bilaterally including multiple lesions oriented perpendicularly to
the lateral ventricles. Lesions are present in the corpus callosum,
brainstem, cerebellum, and left middle cerebellar peduncle. No
lesions demonstrate restricted diffusion or enhancement.

Vascular: Major intracranial vascular flow voids are preserved.

Skull and upper cervical spine: Unremarkable bone marrow signal.

Sinuses/Orbits: Unremarkable orbits. Small mucous retention cyst in
the left maxillary sinus. Clear mastoid air cells.

Other: None.

MRI CERVICAL SPINE FINDINGS

Alignment: Cervical spine straightening.  No listhesis.

Vertebrae: No fracture, suspicious osseous lesion, or significant
marrow edema.

Cord: Widespread patchy and confluent T2 hyperintensity throughout
the entirety of the cervical spinal cord as well as in the included
portion of the upper thoracic cord. No cord expansion or abnormal
enhancement.

Posterior Fossa, vertebral arteries, paraspinal tissues:
Unremarkable.

Disc levels: Disc space heights are preserved throughout the
cervical spine. No disc herniation or spinal stenosis is evident.
Moderate right facet arthrosis at C3-4 results in mild right neural
foraminal stenosis.
IMPRESSION: Extensive chronic demyelinating disease throughout the brain and
cervical spinal cord. No evidence of active demyelination.

## 2019-12-11 IMAGING — MR MR CERVICAL SPINE WO/W CM
5 of 8 series · 27 of 48 positions shown · IV contrast (20 MULTIHANCE)
Comparison: None.

CLINICAL DATA: Multiple sclerosis.

EXAM:
MRI HEAD WITHOUT AND WITH CONTRAST
MRI CERVICAL SPINE WITHOUT AND WITH CONTRAST
TECHNIQUE: Multiplanar, multiecho pulse sequences of the brain and surrounding
structures, and cervical spine, to include the craniocervical
junction and cervicothoracic junction, were obtained without and
with intravenous contrast.
CONTRAST:  20mL MULTIHANCE GADOBENATE DIMEGLUMINE 529 MG/ML IV SOLN

[Series 2: T1 · sagittal · 3.0mm · 0.66mm/px · 4 of 15 slices shown (1 of 3)]
[im 1/15]
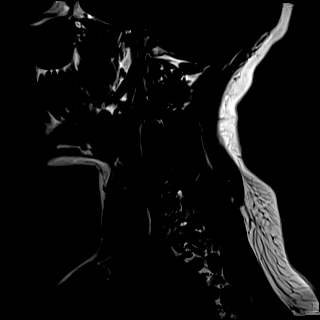
[im 5/15]
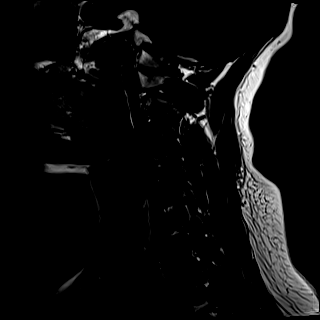
[im 10/15]
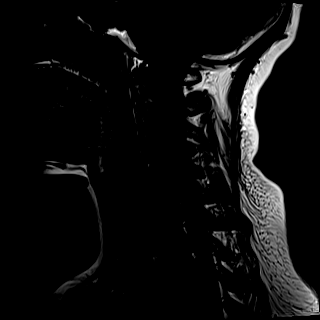
[im 15/15]
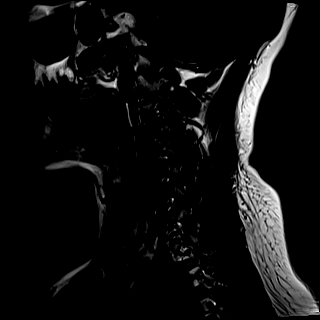

[Series 4: T2 · axial · 3.0mm · 0.50mm/px · z∈[-203,-104]mm · 8 of 32 slices shown (1 of 2)]
[im 1/32]
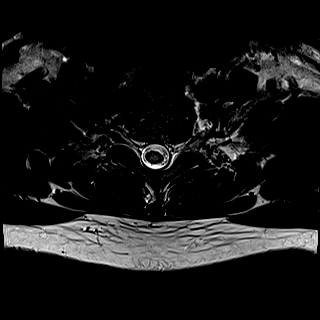
[im 5/32]
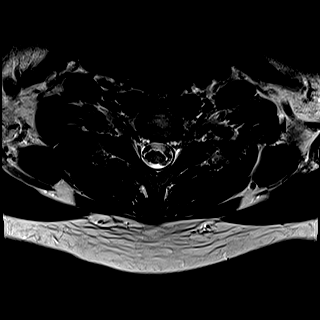
[im 9/32]
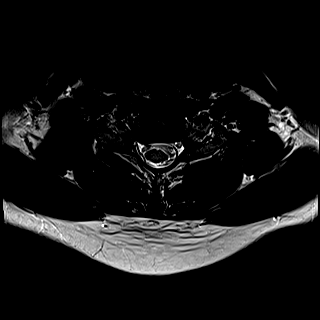
[im 14/32]
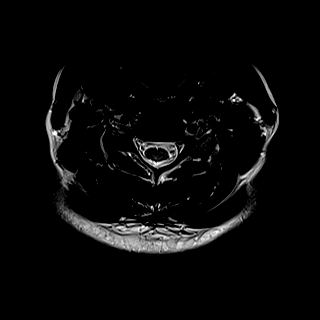
[im 18/32]
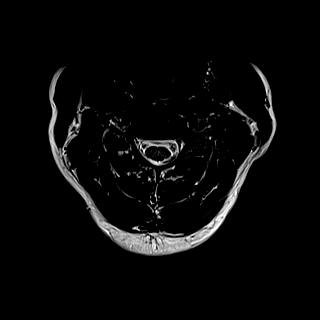
[im 23/32]
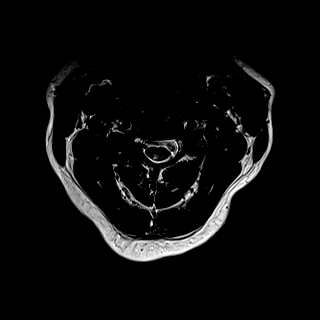
[im 27/32]
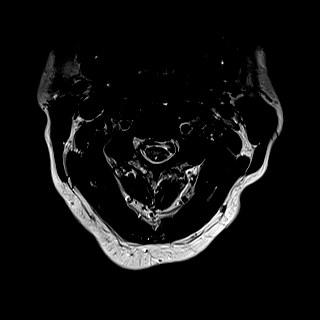
[im 32/32]
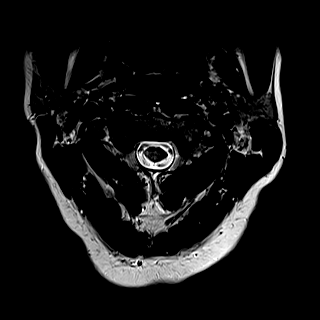

[Series 6: T1 · axial · non-contrast · 3.0mm · 0.31mm/px · z∈[-203,-104]mm · 8 of 32 slices shown (2 of 3)]
[im 1/32]
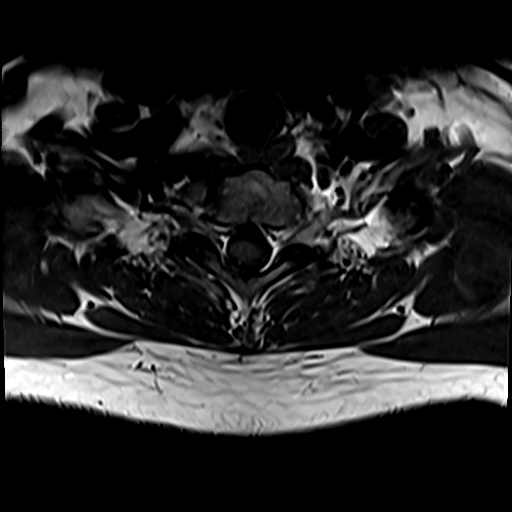
[im 5/32]
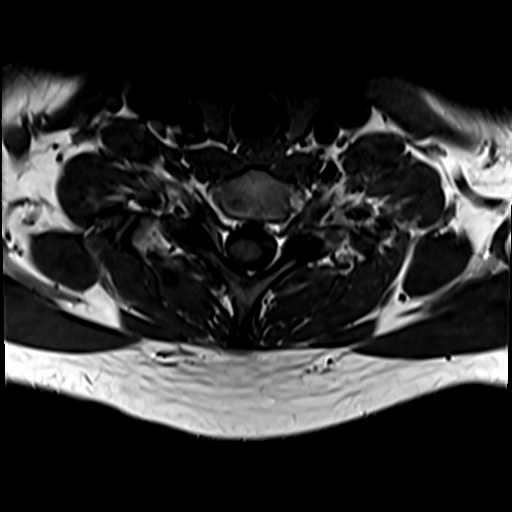
[im 9/32]
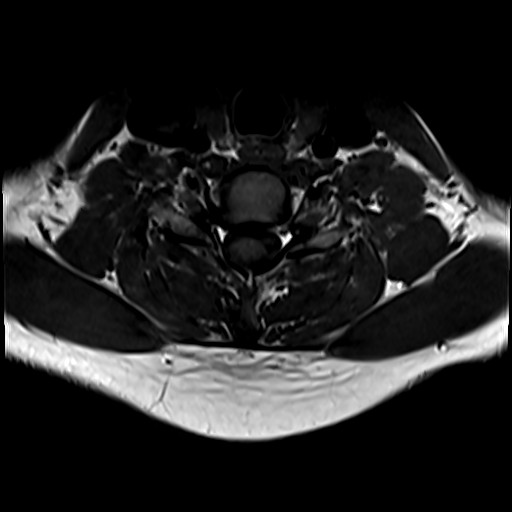
[im 14/32]
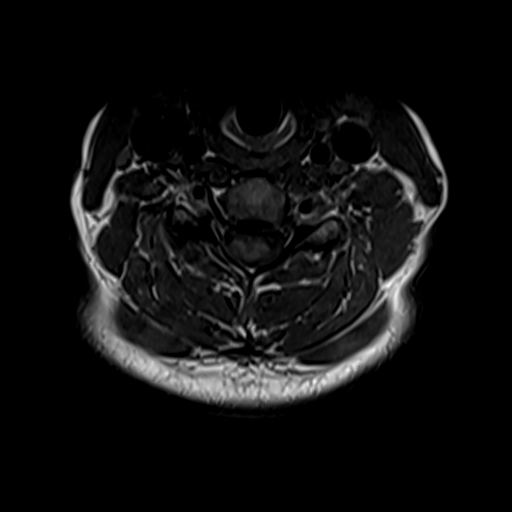
[im 18/32]
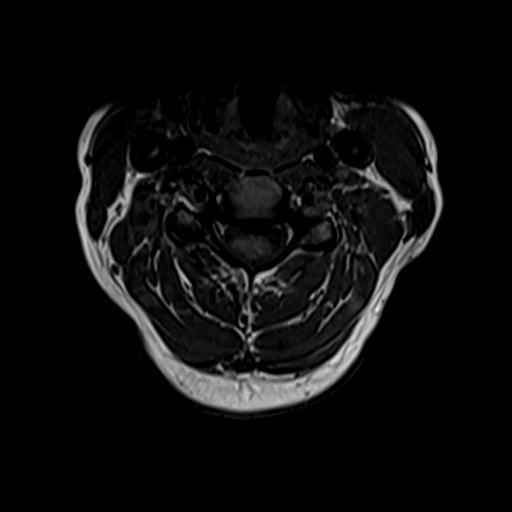
[im 23/32]
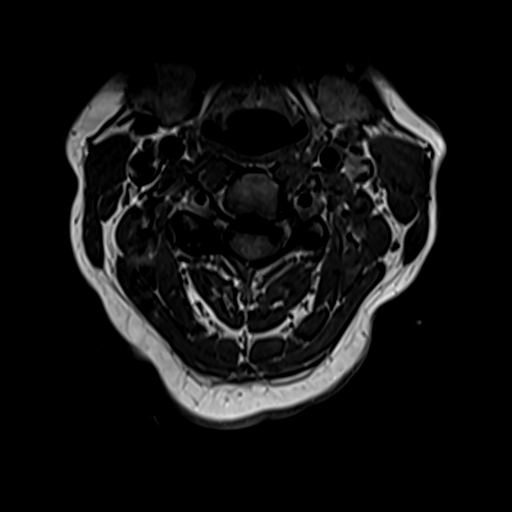
[im 27/32]
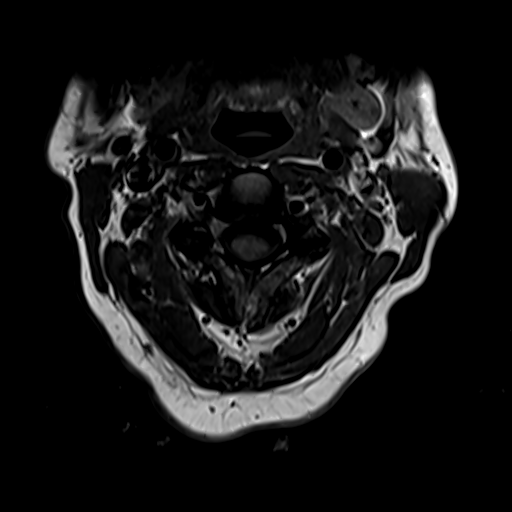
[im 32/32]
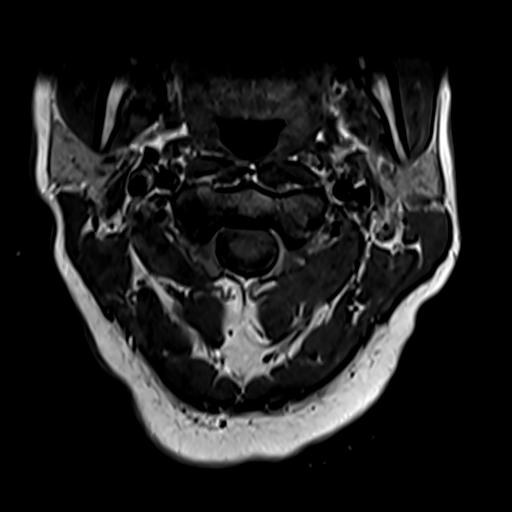

[Series 7: T2 · sagittal · 3.0mm · 0.55mm/px · 4 of 15 slices shown (2 of 2)]
[im 1/15]
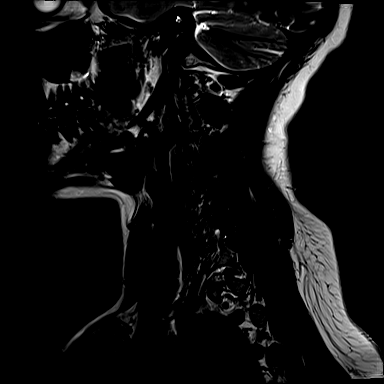
[im 5/15]
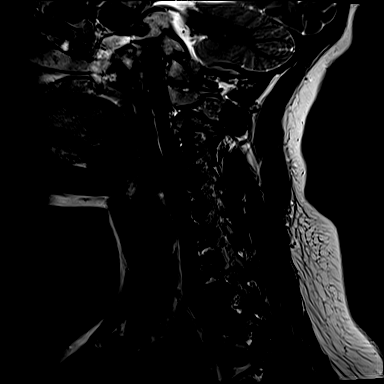
[im 10/15]
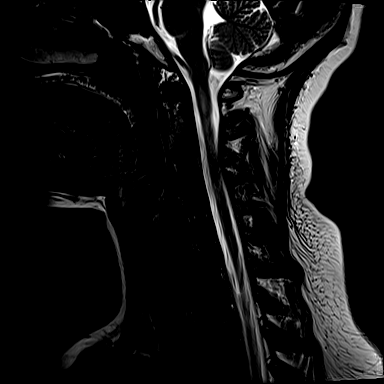
[im 15/15]
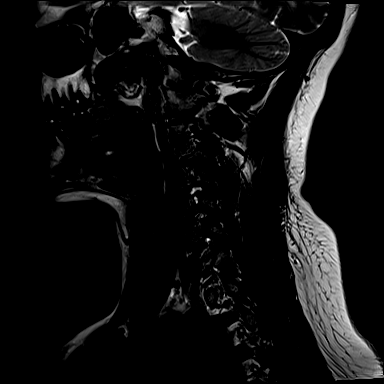

[Series 8: T1 · axial · 3.0mm · 0.31mm/px · z∈[-203,-177]mm · 3 of 32 slices shown (3 of 3)]
[im 1/32]
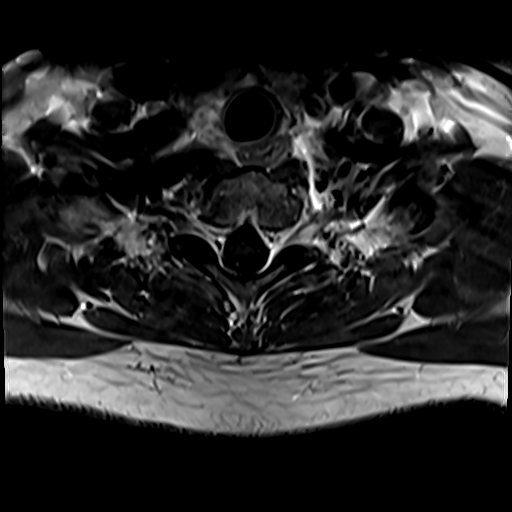
[im 5/32]
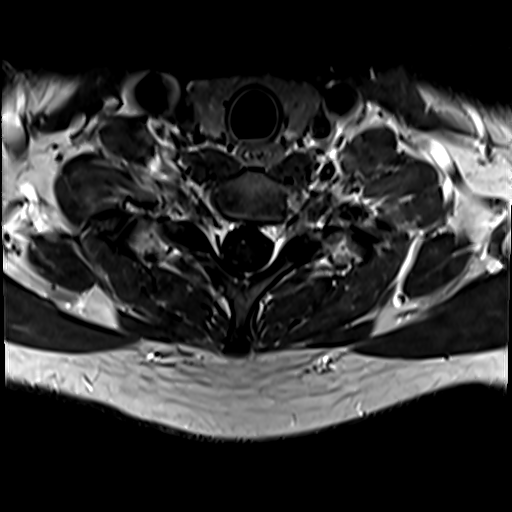
[im 9/32]
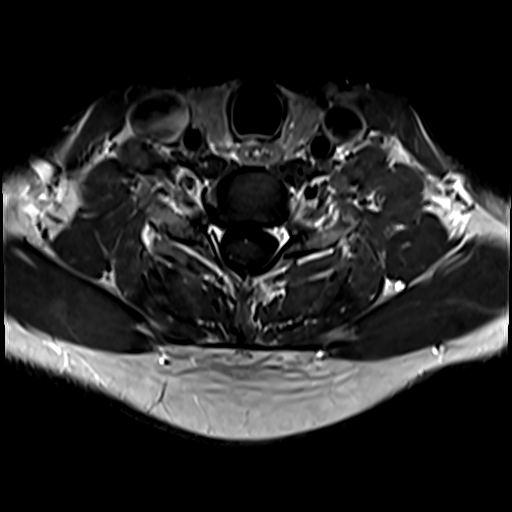

[27 of 48 positions shown; findings below may reference images not displayed]

FINDINGS: MRI HEAD FINDINGS

Brain: There is no evidence of an acute infarct, intracranial
hemorrhage, mass, midline shift, or extra-axial fluid collection.
The ventricles and sulci are normal. There is extensive cerebral
white matter disease with numerous T2 hyperintense lesions
throughout the juxtacortical, deep, and periventricular white matter
bilaterally including multiple lesions oriented perpendicularly to
the lateral ventricles. Lesions are present in the corpus callosum,
brainstem, cerebellum, and left middle cerebellar peduncle. No
lesions demonstrate restricted diffusion or enhancement.

Vascular: Major intracranial vascular flow voids are preserved.

Skull and upper cervical spine: Unremarkable bone marrow signal.

Sinuses/Orbits: Unremarkable orbits. Small mucous retention cyst in
the left maxillary sinus. Clear mastoid air cells.

Other: None.

MRI CERVICAL SPINE FINDINGS

Alignment: Cervical spine straightening.  No listhesis.

Vertebrae: No fracture, suspicious osseous lesion, or significant
marrow edema.

Cord: Widespread patchy and confluent T2 hyperintensity throughout
the entirety of the cervical spinal cord as well as in the included
portion of the upper thoracic cord. No cord expansion or abnormal
enhancement.

Posterior Fossa, vertebral arteries, paraspinal tissues:
Unremarkable.

Disc levels: Disc space heights are preserved throughout the
cervical spine. No disc herniation or spinal stenosis is evident.
Moderate right facet arthrosis at C3-4 results in mild right neural
foraminal stenosis.
IMPRESSION: Extensive chronic demyelinating disease throughout the brain and
cervical spinal cord. No evidence of active demyelination.

## 2019-12-11 MED ORDER — GADOBENATE DIMEGLUMINE 529 MG/ML IV SOLN
20.0000 mL | Freq: Once | INTRAVENOUS | Status: AC | PRN
  Administered 2019-12-11: 20 mL via INTRAVENOUS

## 2019-12-14 ENCOUNTER — Ambulatory Visit
Admission: RE | Admit: 2019-12-14 | Discharge: 2019-12-14 | Disposition: A | Discharge status: Home / Self Care | Payer: Medicaid Other | Source: Ambulatory Visit | Attending: Physician Assistant | Admitting: Physician Assistant

## 2019-12-14 DIAGNOSIS — G35 Multiple sclerosis: Secondary | ICD-10-CM

## 2019-12-14 IMAGING — MR MR THORACIC SPINE WO/W CM
8 of 10 series · 31 of 48 positions shown · IV contrast (multihance)
Comparison: MRI of the brain and cervical spine [DATE].

CLINICAL DATA: Multiple sclerosis.

EXAM:
MRI THORACIC WITHOUT AND WITH CONTRAST
TECHNIQUE: Multiplanar and multiecho pulse sequences of the thoracic spine were
obtained without and with intravenous contrast.
CONTRAST:  18mL MULTIHANCE GADOBENATE DIMEGLUMINE 529 MG/ML IV SOLN

[Series 17: T1 · sagittal · 3.0mm · 0.83mm/px · 4 of 18 slices shown (1 of 2)]
[im 1/18]
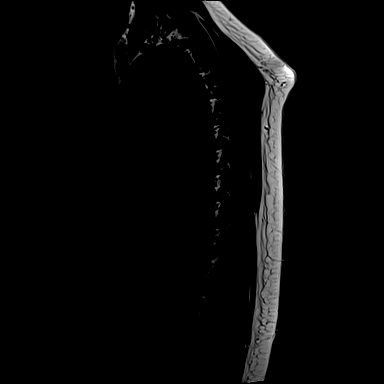
[im 6/18]
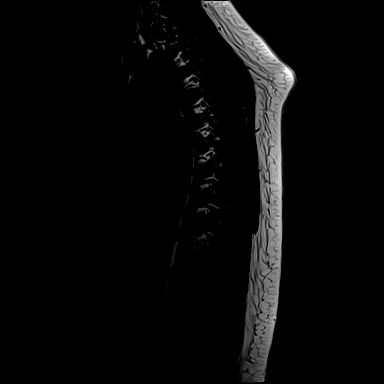
[im 12/18]
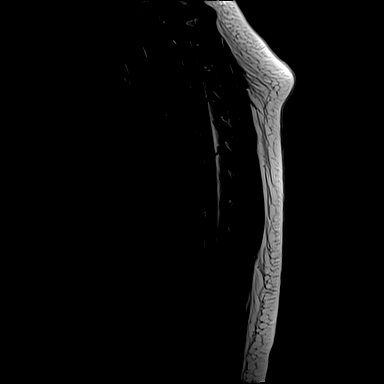
[im 18/18]
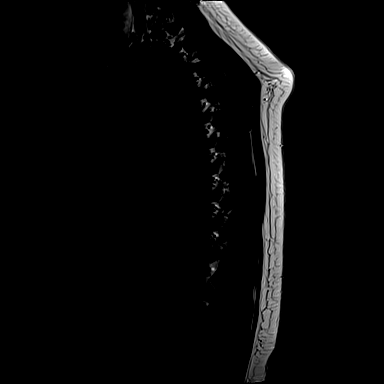

[Series 18: STIR · sagittal · 3.0mm · 1.00mm/px · 3 of 18 slices shown]
[im 1/18]
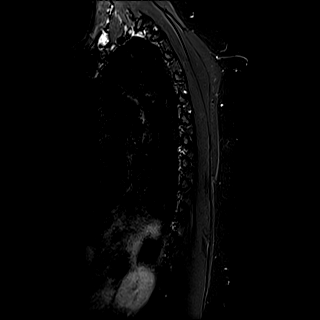
[im 9/18]
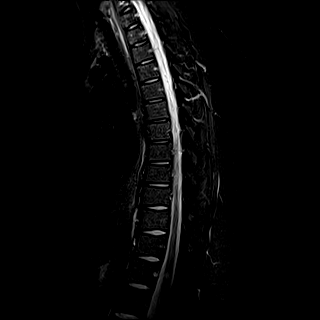
[im 18/18]
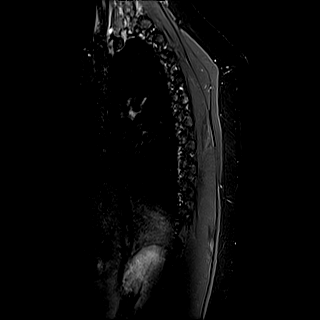

[Series 19: T2 · sagittal · 3.0mm · 0.83mm/px · 3 of 18 slices shown (1 of 3)]
[im 1/18]
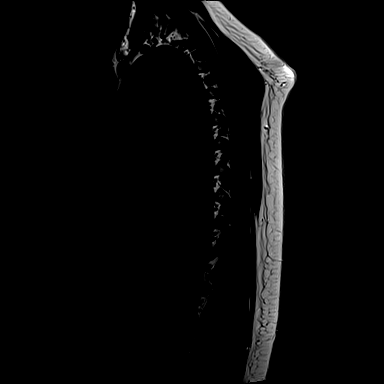
[im 9/18]
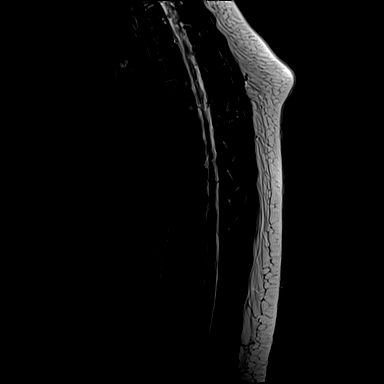
[im 18/18]
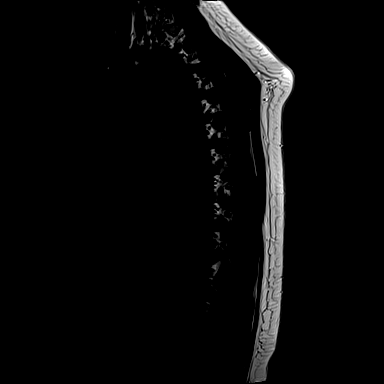

[Series 20: T2 · axial · 4.0mm · 0.35mm/px · z∈[-284,-61]mm · 7 of 39 slices shown (2 of 3)]
[im 1/39]
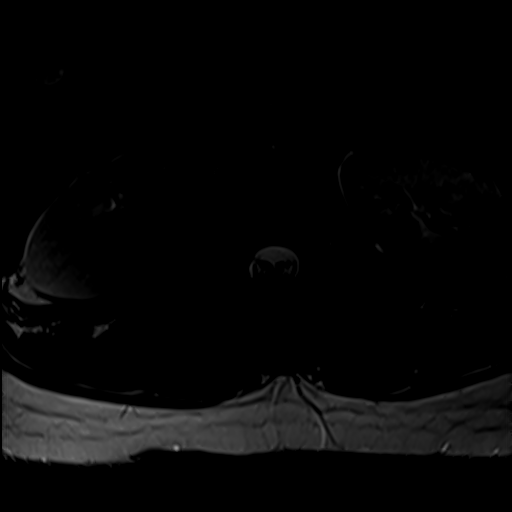
[im 7/39]
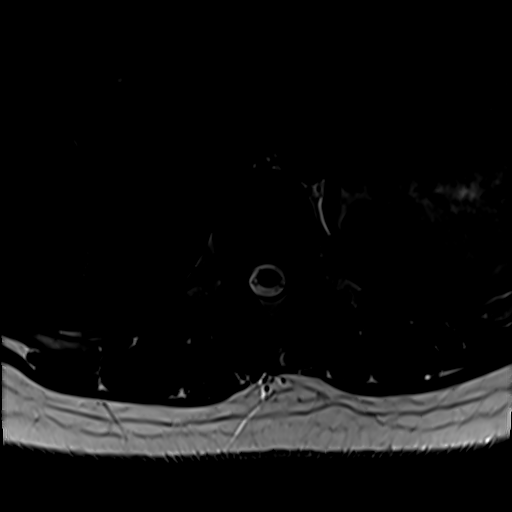
[im 13/39]
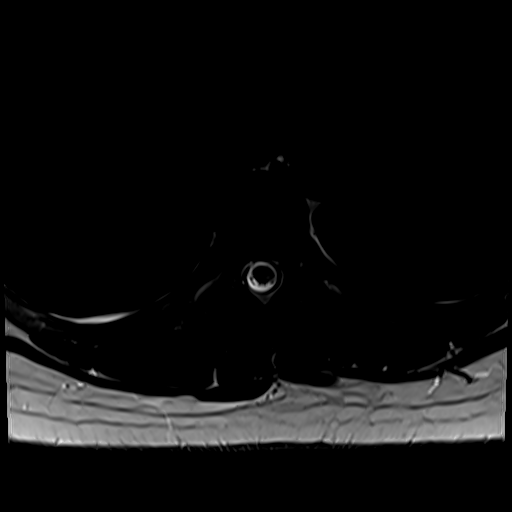
[im 20/39]
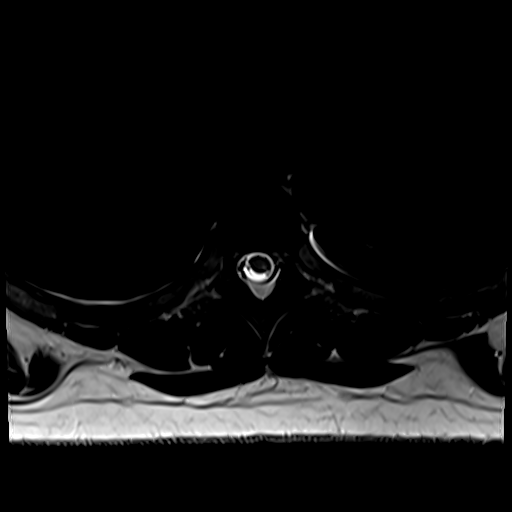
[im 26/39]
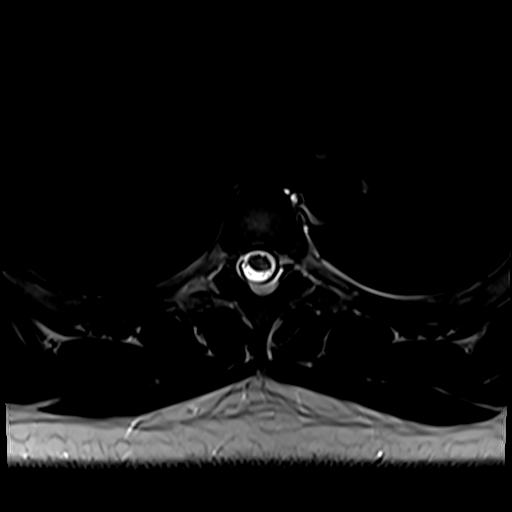
[im 32/39]
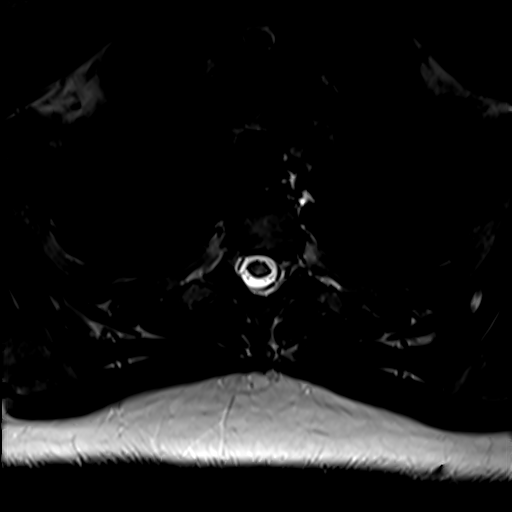
[im 39/39]
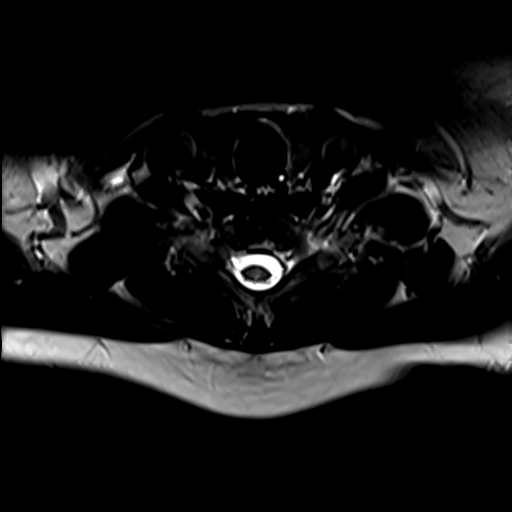

[Series 22: T1 · axial · non-contrast · 4.0mm · 0.56mm/px · z∈[-284,-61]mm · 7 of 39 slices shown (2 of 2)]
[im 1/39]
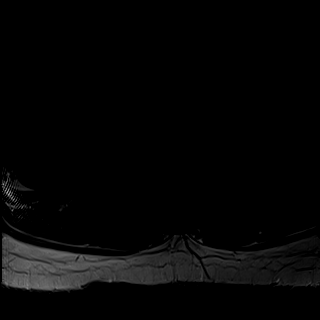
[im 7/39]
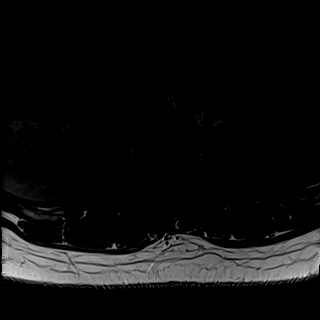
[im 13/39]
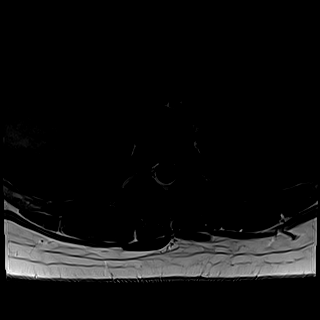
[im 20/39]
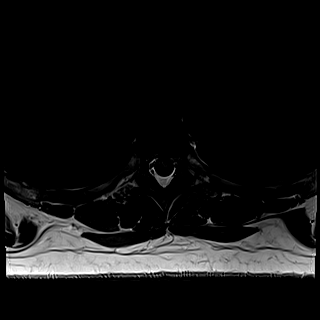
[im 26/39]
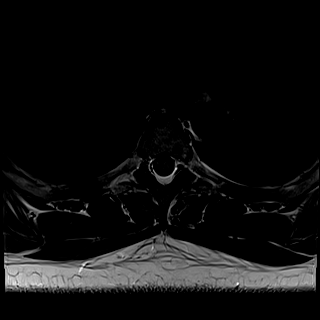
[im 32/39]
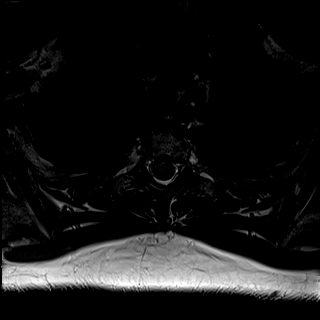
[im 39/39]
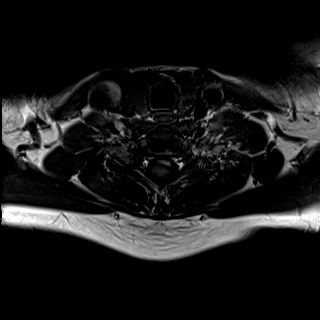

[Series 23: T2 · sagittal · 3.0mm · 0.83mm/px · 3 of 18 slices shown (3 of 3)]
[im 1/18]
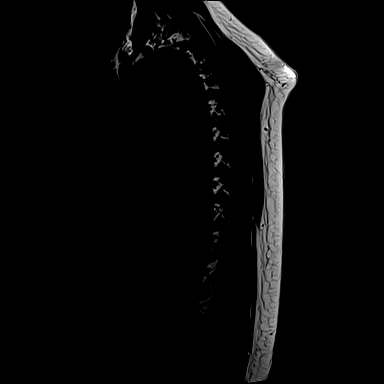
[im 9/18]
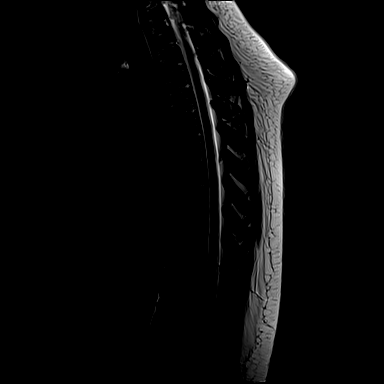
[im 18/18]
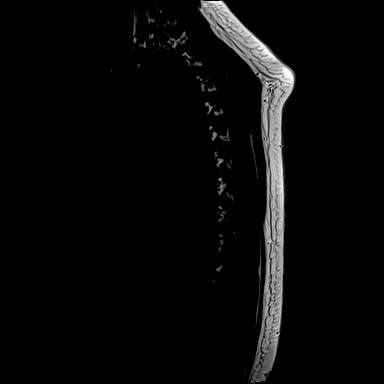

[Series 24: T1 fat-sat · sagittal · 3.0mm · 1.00mm/px · 3 of 18 slices shown]
[im 1/18]
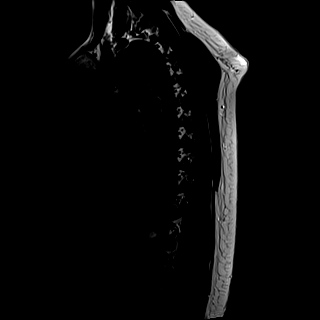
[im 9/18]
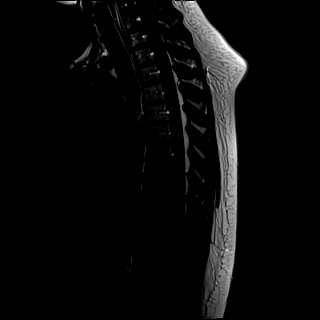
[im 18/18]
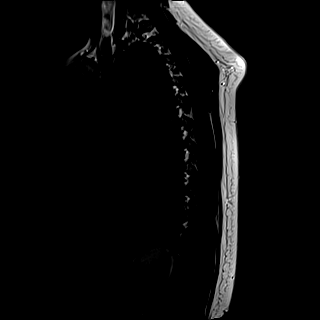

[Series 25: T1 post-contrast · axial · 4.0mm · 0.56mm/px · 1 of 39 slices shown]
[im 1/39]
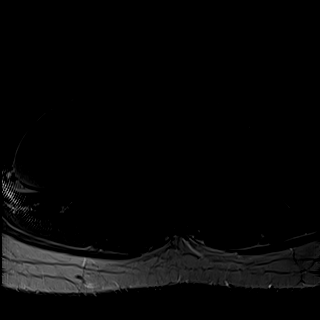

[31 of 48 positions shown; findings below may reference images not displayed]

FINDINGS: MRI THORACIC SPINE FINDINGS

Alignment:  No significant spondylolisthesis.

Vertebrae: Vertebral body height is maintained. No significant
marrow edema or suspicious osseous lesion. No abnormal osseous
enhancement is identified, although evaluation is limited due to
poor fat saturation on the sagittal T1 weighted fat saturated
post-contrast sequence. Small Schmorl node within the T1 superior
endplate.

Cord: There is extensive patchy multifocal T2 hyperintense signal
abnormality throughout the thoracic spinal cord. No abnormal cord
enhancement is demonstrated to suggest active demyelination.

Paraspinal and other soft tissues: Within the limitations of MR
modality, no abnormality is identified within included portions of
the thorax. There are two tiny adjacent T2 hyperintense foci within
the right hepatic lobe measuring up to 5 mm. These foci appear to
demonstrate enhancement on the postcontrast axial T1 weighted
sequence (series 25, image 34).

Disc levels:

Intervertebral disc height and hydration are maintained. There is no
significant disc herniation, spinal canal stenosis or neural
foraminal narrowing within the thoracic spine.

Impression #2 will be called to the ordering clinician or
representative by the Radiologist Assistant, and communication
documented in the PACS or [REDACTED].
IMPRESSION: Extensive patchy multifocal T2 hyperintense signal abnormality
throughout the thoracic spinal cord consistent with chronic sequela
of demyelinating disease. No evidence of active demyelination.

Two adjacent lesions within the right hepatic lobe with apparent
enhancement measuring up to 5 mm as described. Findings are
indeterminate in etiology and a liver protocol contrast-enhanced MRI
is recommended for further characterization.

## 2019-12-14 MED ORDER — GADOBENATE DIMEGLUMINE 529 MG/ML IV SOLN
18.0000 mL | Freq: Once | INTRAVENOUS | Status: AC | PRN
  Administered 2019-12-14: 18 mL via INTRAVENOUS

## 2019-12-20 ENCOUNTER — Other Ambulatory Visit: Payer: Self-pay | Admitting: Physician Assistant

## 2019-12-20 DIAGNOSIS — K769 Liver disease, unspecified: Secondary | ICD-10-CM

## 2020-01-08 ENCOUNTER — Ambulatory Visit
Admission: RE | Admit: 2020-01-08 | Discharge: 2020-01-08 | Disposition: A | Discharge status: Home / Self Care | Payer: Medicaid Other | Source: Ambulatory Visit | Attending: Physician Assistant | Admitting: Physician Assistant

## 2020-01-08 ENCOUNTER — Other Ambulatory Visit: Payer: Self-pay

## 2020-01-08 DIAGNOSIS — K769 Liver disease, unspecified: Secondary | ICD-10-CM

## 2020-01-08 IMAGING — MR MR ABDOMEN WO/W CM
11 of 17 series · 28 of 48 positions shown · IV contrast (multihance)
Comparison: MRI thoracic spine dated [DATE]

CLINICAL DATA: Liver lesions on thoracic spine MRI

EXAM:
MRI ABDOMEN WITHOUT AND WITH CONTRAST
TECHNIQUE: Multiplanar multisequence MR imaging of the abdomen was performed
both before and after the administration of intravenous contrast.
CONTRAST:  18mL MULTIHANCE GADOBENATE DIMEGLUMINE 529 MG/ML IV SOLN

[Series 3: cor haste · coronal · 5.0mm · 0.68mm/px · 2 of 30 slices shown]
[im 1/30]
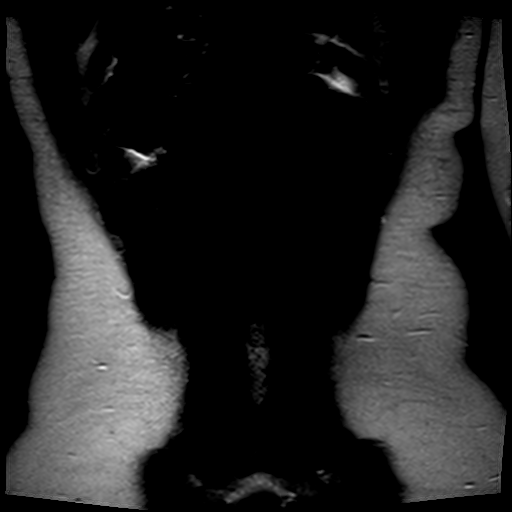
[im 30/30]
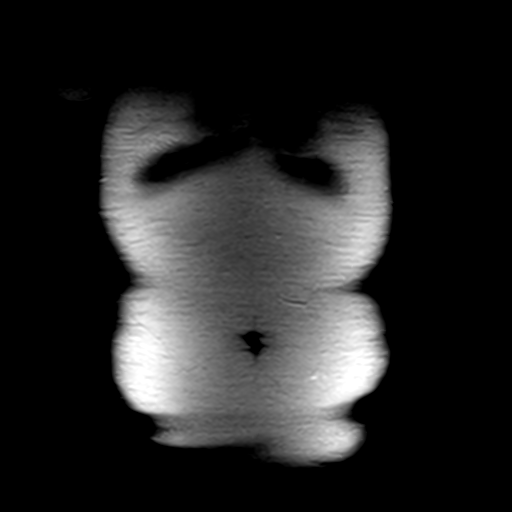

[Series 4: axial haste · axial · 6.0mm · 0.68mm/px · z∈[-60,+164]mm · 2 of 35 slices shown]
[im 1/35]
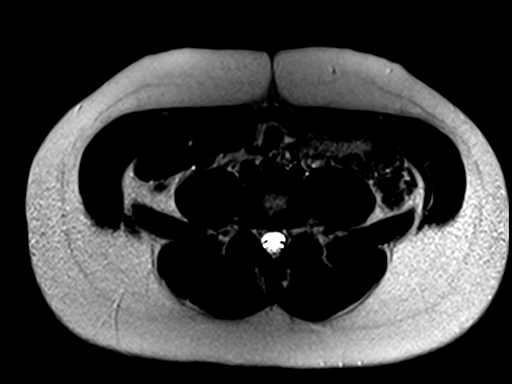
[im 35/35]
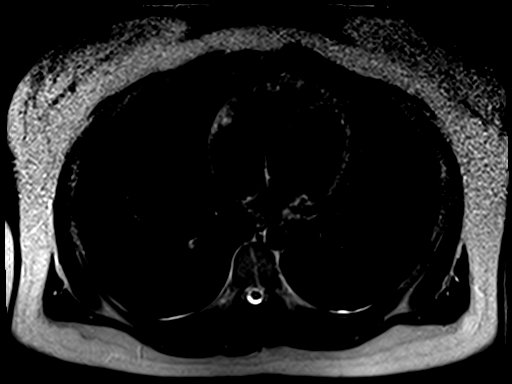

[Series 5: T1 · axial · 6.0mm · 0.68mm/px · z∈[-51,+160]mm · 4 of 66 slices shown]
[im 1/66]
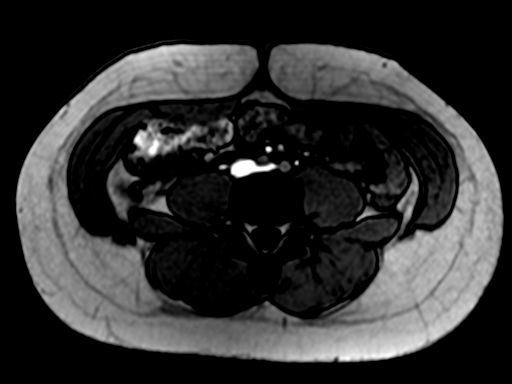
[im 22/66]
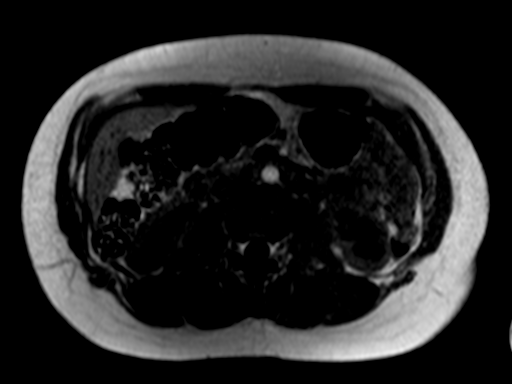
[im 44/66]
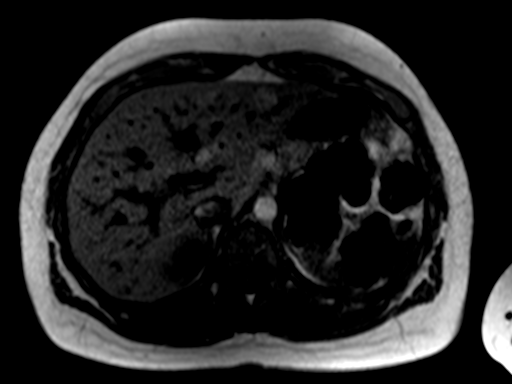
[im 66/66]
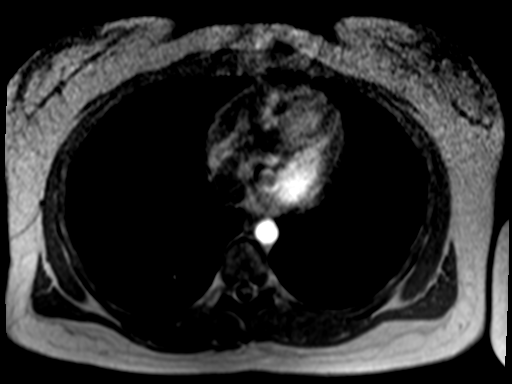

[Series 6: bSSFP · axial · 4.0mm · 0.68mm/px · z∈[-74,+166]mm · 3 of 61 slices shown]
[im 1/61]
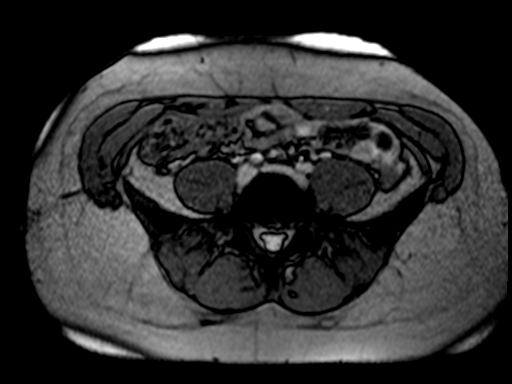
[im 31/61]
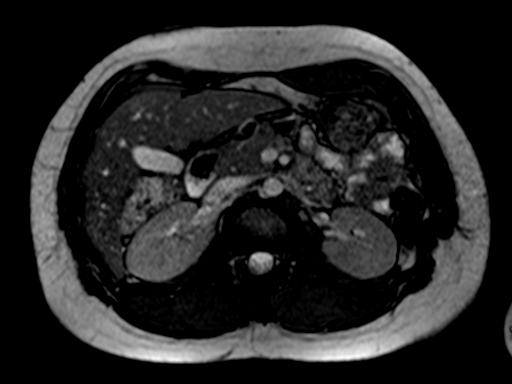
[im 61/61]
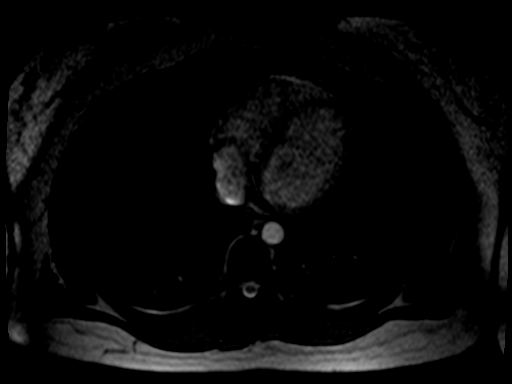

[Series 7: T2 · axial · 6.0mm · 1.09mm/px · z∈[-16,+207]mm · 2 of 32 slices shown]
[im 1/32]
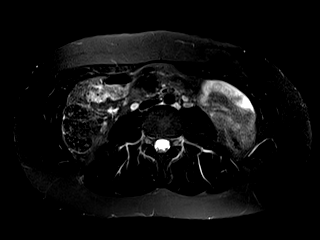
[im 32/32]
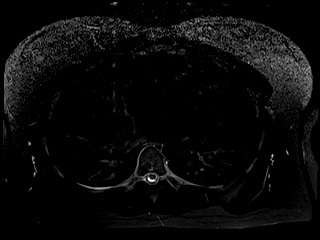

[Series 8: ep2d_[id number]_500_800_p2_trig · axial · 6.0mm · 1.82mm/px · z∈[-18,+205]mm · 4 of 96 slices shown]
[im 1/96]
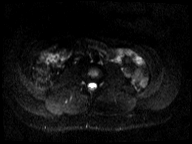
[im 32/96]
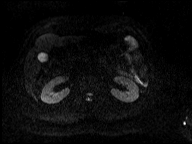
[im 64/96]
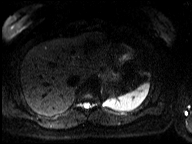
[im 96/96]
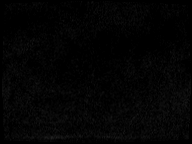

[Series 9: [id number]_800_p2_trig_adc · axial · 6.0mm · 1.82mm/px · 1 of 32 slices shown]
[im 1/32]
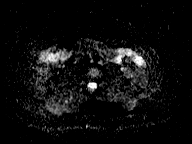

[Series 10: T1 dynamic · axial · non-contrast · 2.5mm · 0.74mm/px · z∈[-54,+163]mm · 3 of 88 slices shown]
[im 1/88]
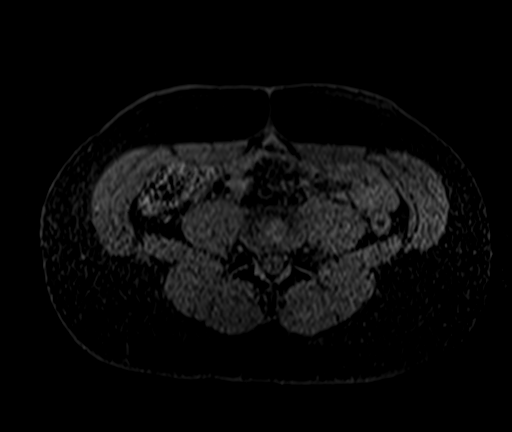
[im 44/88]
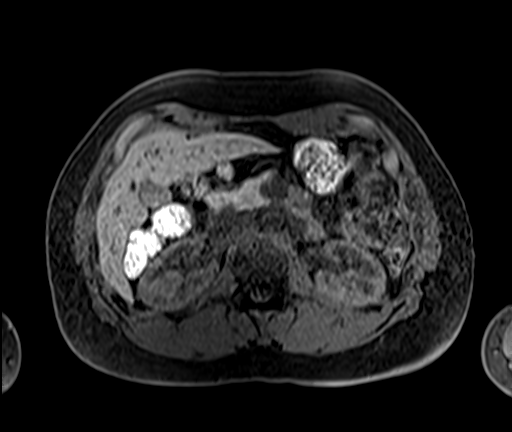
[im 88/88]
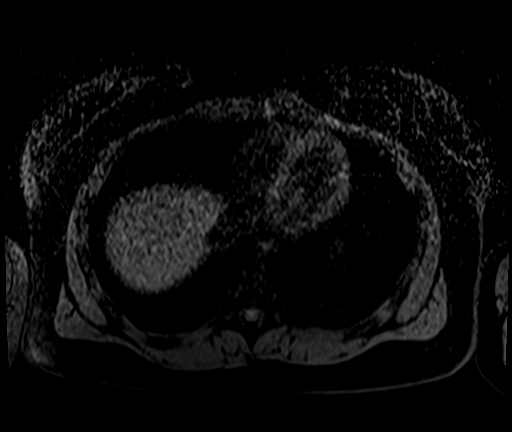

[Series 11: T1 dynamic post-contrast · axial · 2.5mm · 0.74mm/px · z∈[-54,+163]mm · 3 of 88 slices shown (1 of 3)]
[im 1/88]
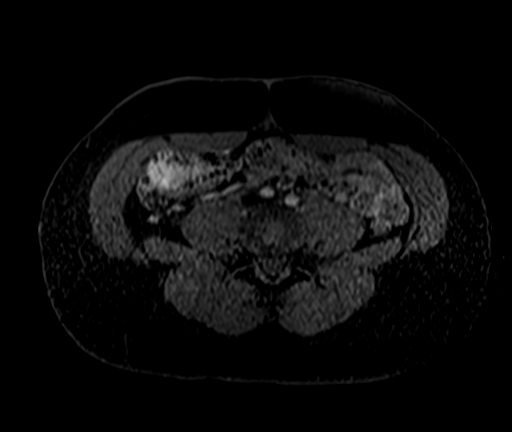
[im 44/88]
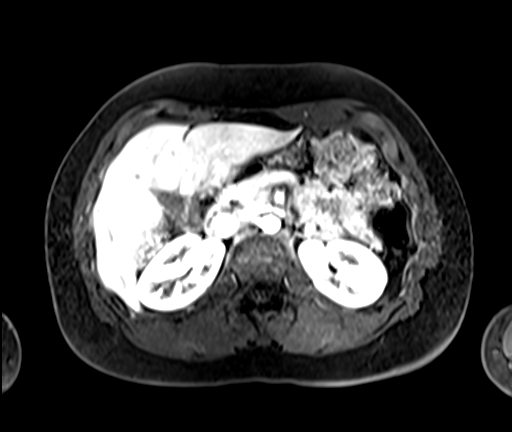
[im 88/88]
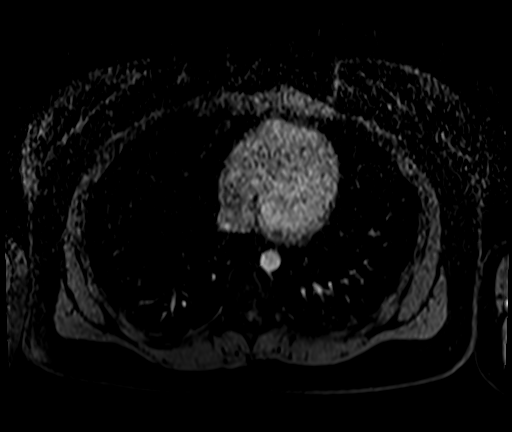

[Series 12: T1 dynamic post-contrast · axial · 2.5mm · 0.74mm/px · z∈[-54,+163]mm · 3 of 88 slices shown (2 of 3)]
[im 1/88]
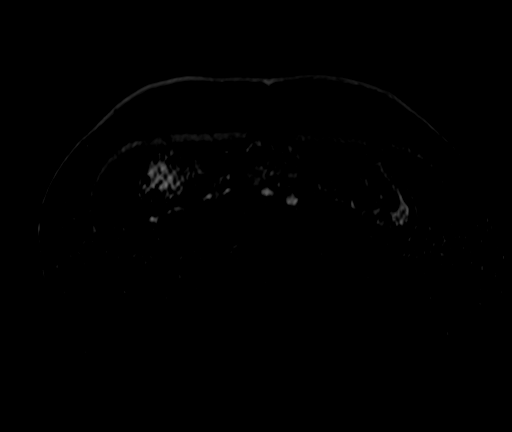
[im 44/88]
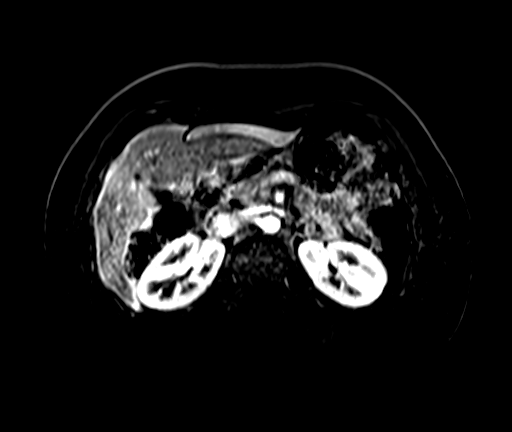
[im 88/88]
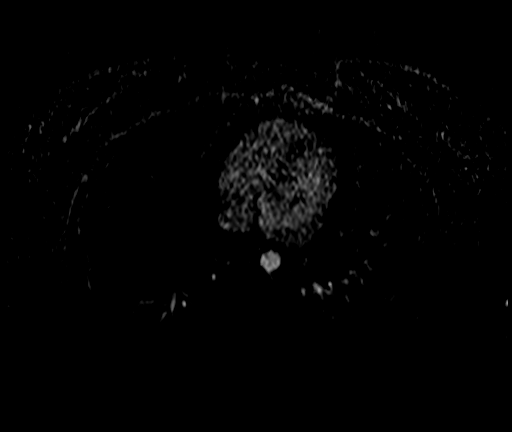

[Series 13: T1 dynamic post-contrast · axial · 2.5mm · 0.74mm/px · 1 of 88 slices shown (3 of 3)]
[im 1/88]
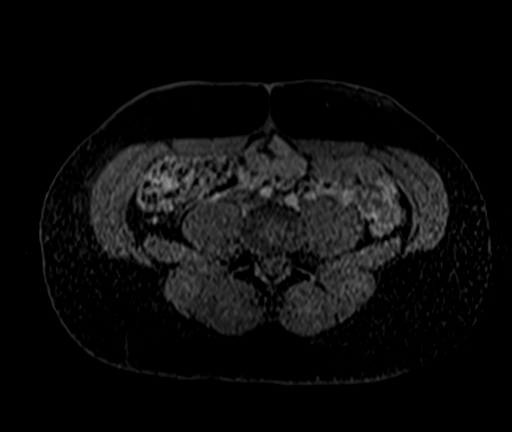

[28 of 48 positions shown; findings below may reference images not displayed]

FINDINGS: Lower chest: Lung bases are clear.

Hepatobiliary: Two adjacent 5 mm T2 hyperdense lesions in the
posterior right hepatic lobe (series 4/image 15), with enhancement
following contrast administration, likely reflecting flash filling
hemangiomas.

Gallbladder is unremarkable. No intrahepatic or extrahepatic ductal
dilatation.

Pancreas:  Within normal limits.

Spleen:  Within normal limits.

Adrenals/Urinary Tract:  Adrenal glands are within normal limits.

Kidneys are within normal limits.  No hydronephrosis.

Stomach/Bowel: Stomach is within normal limits.

Visualized bowel is unremarkable.

Vascular/Lymphatic:  No evidence of abdominal aortic aneurysm.

No suspicious abdominal lymphadenopathy.

Other:  No abdominal ascites.

Musculoskeletal: No focal osseous lesions.
IMPRESSION: Two adjacent 5 mm lesions in the posterior right hepatic lobe,
likely reflecting flash filling hemangiomas, benign. These are
considered low risk and do not warrant additional follow-up.

## 2020-01-08 MED ORDER — GADOBENATE DIMEGLUMINE 529 MG/ML IV SOLN
18.0000 mL | Freq: Once | INTRAVENOUS | Status: AC | PRN
  Administered 2020-01-08: 18 mL via INTRAVENOUS

## 2020-06-08 ENCOUNTER — Other Ambulatory Visit: Payer: Self-pay

## 2020-06-08 ENCOUNTER — Encounter: Payer: Self-pay | Admitting: Physical Therapy

## 2020-06-08 ENCOUNTER — Ambulatory Visit: Payer: Medicaid Other | Attending: Neurology | Admitting: Physical Therapy

## 2020-06-08 DIAGNOSIS — R2689 Other abnormalities of gait and mobility: Secondary | ICD-10-CM | POA: Diagnosis not present

## 2020-06-08 DIAGNOSIS — R2681 Unsteadiness on feet: Secondary | ICD-10-CM | POA: Diagnosis present

## 2020-06-08 DIAGNOSIS — M6281 Muscle weakness (generalized): Secondary | ICD-10-CM

## 2020-06-08 NOTE — Therapy (Signed)
[facility] [address] [place], [place], [address] Phone: [phone]   Fax:  [phone]  Physical Therapy Evaluation  Patient Details  Name: Kimberly Strickland MRN: [id number] Date of Birth: April 29, 1990 Referring Provider (PT): Dr. Denice Bors   Encounter Date: 06/08/2020   PT End of Session - 06/08/20 0928    Visit Number 1    Number of Visits 13    Authorization Type [place] [employer]    PT Start Time 0933    PT Stop Time 1012    PT Time Calculation (min) 39 min    Activity Tolerance Patient tolerated treatment well    Behavior During Therapy [facility] for tasks assessed/performed           Past Medical History:  Diagnosis Date  . Anemia    with pregnancy  . Family history of adverse reaction to anesthesia    mother  "drops her heart rate"  . History of abnormal cervical Pap smear   . History of incompetent cervix, currently pregnant   . MS (multiple sclerosis) [facility])    neurologist-- dr Youlanda Mighty @ [facility]---  relapsing type,  (11-13-2018 per pt currently no treatment due to pregnancy)  . Occipital neuralgia   . SMA (spinal muscular atrophy) (HCC)     Past Surgical History:  Procedure Laterality Date  . CERVICAL CERCLAGE N/A 11/17/2018   Procedure: CERCLAGE CERVICAL;  Surgeon: Mitchel Honour, DO;  Location: [facility] [place];  Service: Gynecology;  Laterality: N/A;  . ESOPHAGOGASTRODUODENOSCOPY (EGD) WITH PROPOFOL  2017  approx.    There were no vitals filed for this visit.    Subjective Assessment - 06/08/20 0927    Subjective MS dx 2010, and last relapse was 2017-which was a little change of vision.  Then began to notice more drag through the R side.  Have looked into brace for R side and orthotist thought hip weakness was a factor; the doctor has mentioned electrical stimulation for R leg.  More recently, started the Ocrevus infustion every 6 months.  Was a former athlete, and I  want to make sure to stay active and be able to exercise.    Pertinent History PMH:  anemia with pregnancy, MS, occiptial neuralgia, SMA?    Patient Stated Goals Pt's goal is to be able to jog and stay active.    Currently in Pain? No/denies              [facility] PT Assessment - 06/08/20 0940      Assessment   Medical Diagnosis MS, gait instability    Referring Provider (PT) Dr. Denice Bors    Onset Date/Surgical Date 05/22/20   MD order   Hand Dominance Right      Precautions   Precautions None      Balance Screen   Has the patient fallen in the past 6 months No    Has the patient had a decrease in activity level because of a fear of falling?  Yes   trips over the R leg alot   Is the patient reluctant to leave their home because of a fear of falling?  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   Leisure Stays at home with young children; enjoys exercise and being outdoors      Observation/Other Assessments   Focus on Therapeutic Outcomes (FOTO)  NA      Sensation   Additional Comments Reports some tingling sensations in R hand and R toes      ROM / Strength   AROM / PROM / Strength AROM;Strength;PROM      AROM   Overall AROM  Deficits    Overall AROM Comments decreased AROM R ankle dflex, 7 degrees      PROM   Overall PROM  Within functional limits for tasks performed    Overall PROM Comments PROM R ankle dflex 10 degrees      Strength   Overall Strength Deficits    Strength Assessment Site Hip;Knee;Ankle    Right/Left Hip Right;Left    Right Hip Flexion 3+/5    Right Hip Extension 3+/5    Right Hip ABduction 3+/5    Left Hip Flexion  4/5    Left Hip Extension 4/5    Right/Left Knee Right;Left    Right Knee Flexion 2/5    Right Knee Extension 4/5    Left Knee Flexion 5/5    Left Knee Extension 5/5    Right/Left Ankle Right;Left    Right Ankle Dorsiflexion 3+/5    Right Ankle Inversion 4/5    Right Ankle Eversion 3+/5    Left Ankle Dorsiflexion 5/5      Transfers   Transfers Sit to Stand;Stand to Sit    Sit to Stand 5: Supervision;Without upper extremity assist;From chair/3-in-1    Five time sit to stand comments  13.16    Stand to Sit 5: Supervision;Without upper extremity assist;To chair/3-in-1      Ambulation/Gait   Ambulation/Gait Yes    Ambulation/Gait Assistance 6: Modified independent (Device/Increase time)    Ambulation Distance (Feet) 250 Feet    Assistive device None    Gait Pattern Step-through pattern;Decreased step length - right;Decreased stance time - right;Decreased hip/knee flexion - right;Decreased dorsiflexion - right    Ambulation Surface Level;Indoor    Gait velocity 9.28 sec =3.53 ft/sec      Standardized Balance Assessment   Standardized Balance Assessment Dynamic Gait Index      Dynamic Gait Index   Level Surface Mild Impairment    Change in Gait Speed Mild Impairment    Gait with Horizontal Head Turns Normal    Gait with Vertical Head Turns Normal    Gait and Pivot Turn Mild Impairment    Step Over Obstacle Mild Impairment    Step Around Obstacles Mild Impairment    Steps Mild Impairment    Total Score 18    DGI comment: Scores <19/24 indicate increased fall risk; more difficulty with descending steps      High Level Balance   High Level Balance Comments SLS:  RLE 4.56, LLE 6.97; tandem 4 sec with LLE posterior, 1.59 sec with RLE posterior                      Objective measurements completed on examination: See above findings.               PT Education - 06/08/20 1239    Education Details PT eval results, PT POC    Person(s) Educated Patient  Methods Explanation    Comprehension Verbalized understanding            PT Short Term Goals - 06/08/20 1250      PT SHORT TERM GOAL #1   Title Pt will be independent with HEP for improved strength, balance, transfers, and gait.  TARGET 07/07/2020    Baseline No current HEP    Time 4    Period Weeks    Status New      PT SHORT TERM GOAL #2   Title Pt will improve 5x sit<>stand to less than or equal to 11.5 sec to demonstrate improved functional strength and transfer efficiency.    Baseline 13.16 sec with recurvatum R knee last 2 reps    Time 4    Period Weeks    Status New      PT SHORT TERM GOAL #3   Title Pt will ambulate at least 300 ft without evidence of foot drag, decreased swing through initiation on RLE, for improved gait efficiency and safety.    Baseline Pt fatigues with gait after short distances, decreased RLE timing and coordination    Time 4    Period Weeks    Status New             PT Long Term Goals - 06/08/20 1254      PT LONG TERM GOAL #1   Title Pt will be independent with progression of HEP for improved strength, balance, transfers, and gait.  TARGET 09/01/2020    Baseline No current HEP    Time 12    Period Weeks    Status New      PT LONG TERM GOAL #2   Title Pt will improve 5x sit<>stand to less than or equal to 10 sec to demonstrate improved functional strength and transfer efficiency.    Baseline 13.16 sec    Time 13.16    Period Weeks    Status New      PT LONG TERM GOAL #3   Title Pt will improve DGI score to at least 20/24 to decrease fall risk.    Baseline 18/24 at eval    Time 12    Period Weeks    Status New      PT LONG TERM GOAL #4   Title Pt will negotiate at least 12 steps with UE support of rail, mod I, alternating pattern, for improved stair negotiation.    Baseline supervision alternating pattern, more trouble reported descending steps    Time 12    Period Weeks    Status New      PT LONG TERM GOAL #5   Title Pt will  ambulate at least 1000 ft independently, indoor/outdoor surfaces, with appropriate assistive device/orthotic for imrpoved community ambulationg    Baseline 250 ft indoors gait supervision    Time 12    Period Weeks    Status New                  Plan - 06/08/20 6195    Clinical Impression Statement Pt is a 30 year old female with hx of MS who presents to OPPT with RLE weakness and gait instability.  She presents with decreased RLE strength throughout, decreased balance, decreased timing and coordination of gait.  She is at fall risk per DGI score of 18/24.  She appears to have good flexibility, with muscle weakness in hip extensors, abductors, flexors as well as quads/hamstrings, and ant tib on RLE.  RLE fatigues  with standing, repeated sit<>Stand and gait activities.  She would be a good candidate for trial of Bioness/functional e-stim for muscle re-ed in clinic.  She has also already had an orthotic consult, and they may be waiting until pt begins therapy sessions for additional orthotic discussion.  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  She will benefit from skilled PT to address the above stated deficits to decrease fall risk and improve overall functional mobility.    Personal Factors and Comorbidities Comorbidity 3+    Comorbidities PMH:  anemia with pregnancy, MS, occiptial neuralgia, SMA (pt reports she is a carrier, no sx)    Examination-Activity Limitations Locomotion Level;Transfers;Stairs;Caring for Others    Examination-Participation Restrictions Community Activity;Other   Exercise activities   Stability/Clinical Decision Making Evolving/Moderate complexity    Clinical Decision Making Moderate    Rehab Potential Good    PT Frequency 1x / week    PT Duration 12 weeks   plus eval   PT Treatment/Interventions ADLs/Self Care Home Management;Electrical Stimulation;DME Instruction;Gait training;Stair  training;Functional mobility training;Therapeutic activities;Therapeutic exercise;Balance training;Neuromuscular re-education;Orthotic Fit/Training;Patient/family education    PT Next Visit Plan Trial of Bioness for ant tib/hamstring activation with gait; HEP to address hip strength, quad and hamstring strength, ankle dorsiflexors; at some point, invite Thayer Ohm with [facility] to PT session (pt has already initiated a consult with him, waiting for PT input on possibility of bracing)    Consulted and Agree with Plan of Care Patient           Patient will benefit from skilled therapeutic intervention in order to improve the following deficits and impairments:  Abnormal gait,Difficulty walking,Decreased balance,Decreased mobility,Decreased strength  Visit Diagnosis: Other abnormalities of gait and mobility  Muscle weakness (generalized)  Unsteadiness on feet     Problem List Patient Active Problem List   Diagnosis Date Noted  . Term pregnancy 05/10/2019  . Cervical insufficiency during pregnancy in second trimester, antepartum 05/23/2018  . Pregnancy 08/24/2017  . Lower respiratory infection 05/21/2017  . Fever 05/21/2017  . History of multiple sclerosis (HCC) 05/21/2017  . Flu-like symptoms 05/21/2017    Jahari Wiginton W. 06/08/2020, 1:00 PM Gean Maidens., PT  [facility] [address] [place], [place], [address] Phone: [phone]   Fax:  [phone]  Name: Calene Paradiso MRN: [id number] Date of Birth: 26-May-1990

## 2020-06-09 ENCOUNTER — Other Ambulatory Visit: Payer: Medicaid Other

## 2020-06-09 DIAGNOSIS — Z20822 Contact with and (suspected) exposure to covid-19: Secondary | ICD-10-CM

## 2020-06-10 LAB — SARS-COV-2, NAA 2 DAY TAT

## 2020-06-10 LAB — NOVEL CORONAVIRUS, NAA: SARS-CoV-2, NAA: NOT DETECTED

## 2020-06-16 ENCOUNTER — Ambulatory Visit: Payer: Medicaid Other | Admitting: Physical Therapy

## 2020-06-16 ENCOUNTER — Other Ambulatory Visit: Payer: Self-pay

## 2020-06-16 ENCOUNTER — Encounter: Payer: Self-pay | Admitting: Physical Therapy

## 2020-06-16 DIAGNOSIS — R2689 Other abnormalities of gait and mobility: Secondary | ICD-10-CM

## 2020-06-16 DIAGNOSIS — M6281 Muscle weakness (generalized): Secondary | ICD-10-CM

## 2020-06-16 DIAGNOSIS — R2681 Unsteadiness on feet: Secondary | ICD-10-CM

## 2020-06-16 NOTE — Therapy (Signed)
[facility] [address] [place], [place], [address] Phone: [phone]   Fax:  [phone]  Physical Therapy Treatment  Patient Details  Name: Kimberly Strickland MRN: [id number] Date of Birth: [birth year] Referring Provider (PT): Dr. Denice Bors   Encounter Date: 06/16/2020   PT End of Session - 06/16/20 2101    Visit Number 2    Number of Visits 13    Date for PT Re-Evaluation 09/08/20    Authorization Type [place] [employer] - 12 PT visits from 06/08/2020 through 09/08/20    Authorization - Visit Number 1    Authorization - Number of Visits 12    PT Start Time 1326    PT Stop Time 1405    PT Time Calculation (min) 39 min    Equipment Utilized During Treatment Other (comment)   Bioness   Activity Tolerance Patient tolerated treatment well    Behavior During Therapy [facility] for tasks assessed/performed           Past Medical History:  Diagnosis Date  . Anemia    with pregnancy  . Family history of adverse reaction to anesthesia    mother  "drops her heart rate"  . History of abnormal cervical Pap smear   . History of incompetent cervix, currently pregnant   . MS (multiple sclerosis) [facility])    neurologist-- dr Youlanda Mighty @ [facility]---  relapsing type,  (11-13-2018 per pt currently no treatment due to pregnancy)  . Occipital neuralgia   . SMA (spinal muscular atrophy) (HCC)     Past Surgical History:  Procedure Laterality Date  . CERVICAL CERCLAGE N/A 11/17/2018   Procedure: CERCLAGE CERVICAL;  Surgeon: Mitchel Honour, DO;  Location: [facility];  Service: Gynecology;  Laterality: N/A;  . ESOPHAGOGASTRODUODENOSCOPY (EGD) WITH PROPOFOL  2017  approx.    There were no vitals filed for this visit.   Subjective Assessment - 06/16/20 1331    Subjective No issues and no questions after eval.  Asking about the Bioness.  Her goal is to be able to jog.    Pertinent History PMH:  anemia with  pregnancy, MS, occiptial neuralgia, SMA?    Patient Stated Goals Pt's goal is to be able to jog and stay active.    Currently in Pain? No/denies                             [facility] Adult PT Treatment/Exercise - 06/16/20 1522      Ambulation/Gait   Ambulation/Gait Yes    Ambulation/Gait Assistance 6: Modified independent (Device/Increase time)    Ambulation Distance (Feet) 345 Feet    Assistive device None    Gait Pattern Step-through pattern;Wide base of support    Ambulation Surface Level;Indoor    Stairs Yes    Stairs Assistance 6: Modified independent (Device/Increase time)    Stair Management Technique Two rails;Alternating pattern;Forwards    Number of Stairs 8    Height of Stairs 6    Gait Comments Performed gait with Bioness functional e-stim with therapist performing adjustments to intensity of stimulation to facilitate increased heel strike at initial stance, closed chain ankle DF during stance phase, increased hip flexion during swing phase and quad activation at initial and mid stance in RLE.  Also utilized Bioness for stair negotiation with alternating sequence with improved RLE clearance when advancing to next step but continued to demonstrate slight foot drag/heel catch when descending.  Also with Bioness performed ambulation with 4 reps stepping over higher obstacle leading with RLE with no evidence of foot catching on obstacle.      Therapeutic Activites    Therapeutic Activities Other Therapeutic Activities    Other Therapeutic Activities Educated pt on purpose of functional e-stim and Bioness; screened pt for precautions and contraindications; none found.  Pt agreeable to trial Bioness.      Modalities   Modalities Geologist, engineering Location R anterior tib and R quad/hip flexor    Electrical Stimulation Action Open and closed chain ankle DF, knee extension and hip flexion    Electrical  Stimulation Parameters see Bioness Tablet 1; steering electrode    Electrical Stimulation Goals Strength;Neuromuscular facilitation                  PT Education - 06/16/20 2100    Education Details purpose of Bioness; plan to use Bioness for strengthening    Person(s) Educated Patient    Methods Explanation    Comprehension Verbalized understanding            PT Short Term Goals - 06/08/20 1250      PT SHORT TERM GOAL #1   Title Pt will be independent with HEP for improved strength, balance, transfers, and gait.  TARGET 07/07/2020    Baseline No current HEP    Time 4    Period Weeks    Status New      PT SHORT TERM GOAL #2   Title Pt will improve 5x sit<>stand to less than or equal to 11.5 sec to demonstrate improved functional strength and transfer efficiency.    Baseline 13.16 sec with recurvatum R knee last 2 reps    Time 4    Period Weeks    Status New      PT SHORT TERM GOAL #3   Title Pt will ambulate at least 300 ft without evidence of foot drag, decreased swing through initiation on RLE, for improved gait efficiency and safety.    Baseline Pt fatigues with gait after short distances, decreased RLE timing and coordination    Time 4    Period Weeks    Status New             PT Long Term Goals - 06/08/20 1254      PT LONG TERM GOAL #1   Title Pt will be independent with progression of HEP for improved strength, balance, transfers, and gait.  TARGET 09/01/2020    Baseline No current HEP    Time 12    Period Weeks    Status New      PT LONG TERM GOAL #2   Title Pt will improve 5x sit<>stand to less than or equal to 10 sec to demonstrate improved functional strength and transfer efficiency.    Baseline 13.16 sec    Time 13.16    Period Weeks    Status New      PT LONG TERM GOAL #3   Title Pt will improve DGI score to at least 20/24 to decrease fall risk.    Baseline 18/24 at eval    Time 12    Period Weeks    Status New      PT LONG TERM GOAL  #4   Title Pt will negotiate at least 12 steps with UE support of rail, mod I, alternating pattern, for improved stair negotiation.    Baseline  supervision alternating pattern, more trouble reported descending steps    Time 12    Period Weeks    Status New      PT LONG TERM GOAL #5   Title Pt will ambulate at least 1000 ft independently, indoor/outdoor surfaces, with appropriate assistive device/orthotic for imrpoved community ambulationg    Baseline 250 ft indoors gait supervision    Time 12    Period Weeks    Status New                 Plan - 06/16/20 2102    Clinical Impression Statement Treatment session focused on initiation of functional electrical stimulation for RLE with Bioness on R anterior tib and quad muscles.  Focused on set up of Bioness settings and then adjusting during gait over level ground.  Utilize Bioness ot perform stair negotiation and clearing R LE over obstacles.  Pt reported RLE fatigue and heaviness while wearing but improved ability to perform hip and knee flexion after treatment.  Pt wishes to use Bioness for strengthening but feels it would hinder her from jogging.    Personal Factors and Comorbidities Comorbidity 3+    Comorbidities PMH:  anemia with pregnancy, MS, occiptial neuralgia, SMA (pt reports she is a carrier, no sx)    Examination-Activity Limitations Locomotion Level;Transfers;Stairs;Caring for Others    Examination-Participation Restrictions Community Activity;Other   Exercise activities   Stability/Clinical Decision Making Evolving/Moderate complexity    Rehab Potential Good    PT Frequency 1x / week    PT Duration 12 weeks   plus eval   PT Treatment/Interventions ADLs/Self Care Home Management;Electrical Stimulation;DME Instruction;Gait training;Stair training;Functional mobility training;Therapeutic activities;Therapeutic exercise;Balance training;Neuromuscular re-education;Orthotic Fit/Training;Patient/family education    PT Next Visit  Plan Bioness Tablet 1, steering electrodes - wants to use Bioness for strengthening only, not for gait or jogging training. HEP to address hip strength, quad and hamstring strength, ankle dorsiflexors; at some point, invite Thayer Ohm with [employer] to PT session (pt has already initiated a consult with him, waiting for PT input on possibility of bracing)    Consulted and Agree with Plan of Care Patient           Patient will benefit from skilled therapeutic intervention in order to improve the following deficits and impairments:  Abnormal gait,Difficulty walking,Decreased balance,Decreased mobility,Decreased strength  Visit Diagnosis: Other abnormalities of gait and mobility  Muscle weakness (generalized)  Unsteadiness on feet     Problem List Patient Active Problem List   Diagnosis Date Noted  . Term pregnancy 05/10/2019  . Cervical insufficiency during pregnancy in second trimester, antepartum 05/23/2018  . Pregnancy 08/24/2017  . Lower respiratory infection 05/21/2017  . Fever 05/21/2017  . History of multiple sclerosis (HCC) 05/21/2017  . Flu-like symptoms 05/21/2017    Dierdre Highman, PT, DPT 06/16/20    9:18 PM    [facility] [address] [place], [place], [address] Phone: [phone]   Fax:  [phone]  Name: Jinnie Onley MRN: [id number] Date of Birth: 03-Jul-1990

## 2020-06-22 ENCOUNTER — Encounter: Payer: Self-pay | Admitting: Physical Therapy

## 2020-06-22 ENCOUNTER — Ambulatory Visit: Payer: Medicaid Other | Admitting: Physical Therapy

## 2020-06-22 ENCOUNTER — Other Ambulatory Visit: Payer: Self-pay

## 2020-06-22 DIAGNOSIS — R2689 Other abnormalities of gait and mobility: Secondary | ICD-10-CM | POA: Diagnosis not present

## 2020-06-22 DIAGNOSIS — M6281 Muscle weakness (generalized): Secondary | ICD-10-CM

## 2020-06-22 DIAGNOSIS — R2681 Unsteadiness on feet: Secondary | ICD-10-CM

## 2020-06-22 NOTE — Patient Instructions (Signed)
Access Code: [facility] URL: [email or web address] Date: 06/22/2020 Prepared by: Sallyanne Kuster  Exercises Standing Repeated Hip Abduction with Resistance - 1 x daily - 5 x weekly - 1 sets - 10 reps Standing Hip Extension with Anchored Resistance - 1 x daily - 5 x weekly - 1 sets - 10 reps Standing Hip Flexion with Anchored Resistance and Chair Support - 1 x daily - 5 x weekly - 1 sets - 10 reps Standing Repeated Hip Adduction with Resistance - 1 x daily - 5 x weekly - 1 sets - 10 reps

## 2020-06-23 NOTE — Therapy (Signed)
[facility] [address] [place], [place], [address] Phone: [phone]   Fax:  [phone]  Physical Therapy Treatment  Patient Details  Name: Kimberly Strickland MRN: [id number] Date of Birth: 08-23-1990 Referring Provider (PT): Dr. Denice Bors   Encounter Date: 06/22/2020   PT End of Session - 06/22/20 1536    Visit Number 3    Number of Visits 13    Date for PT Re-Evaluation 09/08/20    Authorization Type [place] Medicaid [employer] - 12 PT visits from 06/08/2020 through 09/08/20    Authorization - Visit Number 2    Authorization - Number of Visits 12    PT Start Time 1533    PT Stop Time 1615    PT Time Calculation (min) 42 min    Equipment Utilized During Treatment --    Activity Tolerance Patient tolerated treatment well    Behavior During Therapy [facility] for tasks assessed/performed           Past Medical History:  Diagnosis Date  . Anemia    with pregnancy  . Family history of adverse reaction to anesthesia    mother  "drops her heart rate"  . History of abnormal cervical Pap smear   . History of incompetent cervix, currently pregnant   . MS (multiple sclerosis) [facility])    neurologist-- dr Youlanda Mighty @ [facility]---  relapsing type,  (11-13-2018 per pt currently no treatment due to pregnancy)  . Occipital neuralgia   . SMA (spinal muscular atrophy) (HCC)     Past Surgical History:  Procedure Laterality Date  . CERVICAL CERCLAGE N/A 11/17/2018   Procedure: CERCLAGE CERVICAL;  Surgeon: Mitchel Honour, DO;  Location: [facility];  Service: Gynecology;  Laterality: N/A;  . ESOPHAGOGASTRODUODENOSCOPY (EGD) WITH PROPOFOL  2017  approx.    There were no vitals filed for this visit.   Subjective Assessment - 06/22/20 1535    Subjective No new complaints. No falls. Has called Thayer Ohm from [employer] to be her today for brace consult.    Pertinent History PMH:  anemia with pregnancy, MS, occiptial  neuralgia, SMA?    Patient Stated Goals Pt's goal is to be able to jog and stay active.    Currently in Pain? No/denies                  [facility] Adult PT Treatment/Exercise - 06/22/20 1537      Transfers   Transfers Sit to Stand;Stand to Sit    Sit to Stand 5: Supervision;Without upper extremity assist;From chair/3-in-1    Stand to Sit 5: Supervision;Without upper extremity assist;To chair/3-in-1      Ambulation/Gait   Ambulation/Gait Yes    Ambulation/Gait Assistance 6: Modified independent (Device/Increase time)    Ambulation Distance (Feet) --   around gym with session   Assistive device None    Gait Pattern Step-through pattern;Wide base of support    Ambulation Surface Level;Indoor    Gait Comments Thayer Ohm present to begin discussion of proper bracing for gait and jogging. Pt had seen Thayer Ohm prior to starting PT who recommended some therapy to address strengthening, specifically the hip, prior to getting a brace. Was recommended pt attend more than 2 sessions (all that has occured to date) before getting Thayer Ohm involved for bracing. PT will contact Thayer Ohm at a later date after working on strengthening in PT for brace consult. Pt verbalized understanding.      Exercises   Exercises Other Exercises  Other Exercises  right LE strengthening: 6 ich step for step ups forward, then lateral for 10 reps each with cues on form, light UE support for balance; then with green band: 4 way hip kicks for 10 reps each way with cues on posture and ex form. Issued hip kicks to HEP this session.      Programme researcher, broadcasting/film/video Location R anterior tib and R quad/hip flexor    Electrical Stimulation Action unable to get stim to activate muscles/nerves through leggings today. Pt to bring shorts in next time she is on schedule with a Bioness person.    Electrical Stimulation Goals Strength;Neuromuscular facilitation                  PT Education - 06/22/20 1612     Education Details need for additional PT prior to deciding on brace options; intial HEP for hip strengthening.    Person(s) Educated Patient    Methods Explanation;Demonstration;Verbal cues;Handout    Comprehension Verbalized understanding;Returned demonstration;Verbal cues required;Need further instruction            PT Short Term Goals - 06/08/20 1250      PT SHORT TERM GOAL #1   Title Pt will be independent with HEP for improved strength, balance, transfers, and gait.  TARGET 07/07/2020    Baseline No current HEP    Time 4    Period Weeks    Status New      PT SHORT TERM GOAL #2   Title Pt will improve 5x sit<>stand to less than or equal to 11.5 sec to demonstrate improved functional strength and transfer efficiency.    Baseline 13.16 sec with recurvatum R knee last 2 reps    Time 4    Period Weeks    Status New      PT SHORT TERM GOAL #3   Title Pt will ambulate at least 300 ft without evidence of foot drag, decreased swing through initiation on RLE, for improved gait efficiency and safety.    Baseline Pt fatigues with gait after short distances, decreased RLE timing and coordination    Time 4    Period Weeks    Status New             PT Long Term Goals - 06/08/20 1254      PT LONG TERM GOAL #1   Title Pt will be independent with progression of HEP for improved strength, balance, transfers, and gait.  TARGET 09/01/2020    Baseline No current HEP    Time 12    Period Weeks    Status New      PT LONG TERM GOAL #2   Title Pt will improve 5x sit<>stand to less than or equal to 10 sec to demonstrate improved functional strength and transfer efficiency.    Baseline 13.16 sec    Time 13.16    Period Weeks    Status New      PT LONG TERM GOAL #3   Title Pt will improve DGI score to at least 20/24 to decrease fall risk.    Baseline 18/24 at eval    Time 12    Period Weeks    Status New      PT LONG TERM GOAL #4   Title Pt will negotiate at least 12 steps with UE  support of rail, mod I, alternating pattern, for improved stair negotiation.    Baseline supervision alternating pattern, more trouble reported descending steps  Time 12    Period Weeks    Status New      PT LONG TERM GOAL #5   Title Pt will ambulate at least 1000 ft independently, indoor/outdoor surfaces, with appropriate assistive device/orthotic for imrpoved community ambulationg    Baseline 250 ft indoors gait supervision    Time 12    Period Weeks    Status New                 Plan - 06/22/20 1537    Clinical Impression Statement Today's skilled session initially focused on convversation with Thayer Ohm from [place] about bracing options. Thayer Ohm still feels pt needs to work on strengthening prior to obtaining the most appropriate brace. Pt had called and scheduled for him to be here today. Pt  has only been seen twice, for eval and one follow up visit. Discussed that several more visits would be beneficial for increased strengthening before getting Thayer Ohm back out. PT will contact him when it's appropriate. Pt verbalized understanding. Attempted to use Bioness concurrent with strengthening, however pt's wearing leggings too tight and thick to achieve muscle stimulation. Worked on LE strengthening without Bioness with HEP issued today. The pt is progressing toward goals and should benefit from continued PT to progress toward unmet goals.    Personal Factors and Comorbidities Comorbidity 3+    Comorbidities PMH:  anemia with pregnancy, MS, occiptial neuralgia, SMA (pt reports she is a carrier, no sx)    Examination-Activity Limitations Locomotion Level;Transfers;Stairs;Caring for Others    Examination-Participation Restrictions Community Activity;Other   Exercise activities   Stability/Clinical Decision Making Evolving/Moderate complexity    Rehab Potential Good    PT Frequency 1x / week    PT Duration 12 weeks   plus eval   PT Treatment/Interventions ADLs/Self Care Home  Management;Electrical Stimulation;DME Instruction;Gait training;Stair training;Functional mobility training;Therapeutic activities;Therapeutic exercise;Balance training;Neuromuscular re-education;Orthotic Fit/Training;Patient/family education    PT Next Visit Plan Bioness Tablet 1, steering electrodes - wants to use Bioness for strengthening only, not for gait or jogging training. HEP to address hip strength, quad and hamstring strength, ankle dorsiflexors; at some point, invite Thayer Ohm with [employer] to PT session (pt has already initiated a consult with him, waiting for PT input on possibility of bracing)    PT Home Exercise Plan Access Code: [facility]    Consulted and Agree with Plan of Care Patient           Patient will benefit from skilled therapeutic intervention in order to improve the following deficits and impairments:  Abnormal gait,Difficulty walking,Decreased balance,Decreased mobility,Decreased strength  Visit Diagnosis: Other abnormalities of gait and mobility  Muscle weakness (generalized)  Unsteadiness on feet     Problem List Patient Active Problem List   Diagnosis Date Noted  . Term pregnancy 05/10/2019  . Cervical insufficiency during pregnancy in second trimester, antepartum 05/23/2018  . Pregnancy 08/24/2017  . Lower respiratory infection 05/21/2017  . Fever 05/21/2017  . History of multiple sclerosis (HCC) 05/21/2017  . Flu-like symptoms 05/21/2017    Sallyanne Kuster, PTA, [facility] [address] [place], [place] [address] [phone] 06/23/20, 12:15 PM   Name: Kimberly Strickland MRN: [id number] Date of Birth: 10/27/1990

## 2020-06-29 ENCOUNTER — Ambulatory Visit: Payer: Medicaid Other | Admitting: Physical Therapy

## 2020-06-29 ENCOUNTER — Encounter: Payer: Self-pay | Admitting: Physical Therapy

## 2020-06-29 ENCOUNTER — Other Ambulatory Visit: Payer: Self-pay

## 2020-06-29 DIAGNOSIS — R2681 Unsteadiness on feet: Secondary | ICD-10-CM

## 2020-06-29 DIAGNOSIS — M6281 Muscle weakness (generalized): Secondary | ICD-10-CM

## 2020-06-29 DIAGNOSIS — R2689 Other abnormalities of gait and mobility: Secondary | ICD-10-CM | POA: Diagnosis not present

## 2020-06-30 NOTE — Therapy (Signed)
[facility] [address] [place], [place], [address] Phone: [phone]   Fax:  [phone]  Physical Therapy Treatment  Patient Details  Name: Kimberly Strickland MRN: [id number] Date of Birth: February 03, 1991 Referring Provider (PT): Dr. Denice Bors   Encounter Date: 06/29/2020   PT End of Session - 06/29/20 1105    Visit Number 4    Number of Visits 13    Date for PT Re-Evaluation 09/08/20    Authorization Type [place] [employer] - 12 PT visits from 06/08/2020 through 09/08/20    Authorization - Visit Number 3    Authorization - Number of Visits 12    PT Start Time 1104    PT Stop Time 1145    PT Time Calculation (min) 41 min    Equipment Utilized During Treatment Other (comment)   Bioness   Activity Tolerance Patient tolerated treatment well    Behavior During Therapy [facility] for tasks assessed/performed           Past Medical History:  Diagnosis Date  . Anemia    with pregnancy  . Family history of adverse reaction to anesthesia    mother  "drops her heart rate"  . History of abnormal cervical Pap smear   . History of incompetent cervix, currently pregnant   . MS (multiple sclerosis) [facility])    neurologist-- dr Youlanda Mighty @ [facility]---  relapsing type,  (11-13-2018 per pt currently no treatment due to pregnancy)  . Occipital neuralgia   . SMA (spinal muscular atrophy) (HCC)     Past Surgical History:  Procedure Laterality Date  . CERVICAL CERCLAGE N/A 11/17/2018   Procedure: CERCLAGE CERVICAL;  Surgeon: Mitchel Honour, DO;  Location: [facility];  Service: Gynecology;  Laterality: N/A;  . ESOPHAGOGASTRODUODENOSCOPY (EGD) WITH PROPOFOL  2017  approx.    There were no vitals filed for this visit.   Subjective Assessment - 06/29/20 1105    Subjective No new complaints. No falls or pain to report.    Pertinent History PMH:  anemia with pregnancy, MS, occiptial neuralgia, SMA?    Patient  Stated Goals Pt's goal is to be able to jog and stay active.    Currently in Pain? No/denies                 [facility] Adult PT Treatment/Exercise - 06/29/20 1106      Transfers   Transfers Sit to Stand;Stand to Sit    Sit to Stand 6: Modified independent (Device/Increase time)    Stand to Sit 6: Modified independent (Device/Increase time)      Ambulation/Gait   Ambulation/Gait Yes    Ambulation/Gait Assistance 6: Modified independent (Device/Increase time)    Ambulation Distance (Feet) --   around the gym with session   Assistive device None    Gait Pattern Step-through pattern;Wide base of support    Ambulation Surface Level;Indoor      Neuro Re-ed    Neuro Re-ed Details  for strengthening/NMR concurrent with Bioness to right LE: standing across red foam beam- alternating forward heel taps with on time, rest wtih off times for 6-7 reps each side, then mini squats with on times, rest with off times for 10 reps. Light to no UE support with min guard assist for safety .      Exercises   Exercises Other Exercises    Other Exercises  concurrent with Bioness to right LE: hovering over mat table in low squat for on  time of 5 sec's, sitting on mat for rest time of 8 sec's for 2 sets of 10 reps. no UE support (hands clasped together).      Knee/Hip Exercises: Aerobic   Elliptical concurrent with Bioness to right LE: for 2 minutes forward, then 2 minutes backwards with supervision and bil UE support.      Programme researcher, broadcasting/film/video Location R anterior tib and R quad/hip flexor    Electrical Stimulation Action for increased muscle activation and strengthening    Electrical Stimulation Parameters Refer to tablet 1 for adjusted parameters; Medical sales representative Goals Strength;Neuromuscular facilitation                    PT Short Term Goals - 06/08/20 1250      PT SHORT TERM GOAL #1   Title Pt will be independent with HEP for  improved strength, balance, transfers, and gait.  TARGET 07/07/2020    Baseline No current HEP    Time 4    Period Weeks    Status New      PT SHORT TERM GOAL #2   Title Pt will improve 5x sit<>stand to less than or equal to 11.5 sec to demonstrate improved functional strength and transfer efficiency.    Baseline 13.16 sec with recurvatum R knee last 2 reps    Time 4    Period Weeks    Status New      PT SHORT TERM GOAL #3   Title Pt will ambulate at least 300 ft without evidence of foot drag, decreased swing through initiation on RLE, for improved gait efficiency and safety.    Baseline Pt fatigues with gait after short distances, decreased RLE timing and coordination    Time 4    Period Weeks    Status New             PT Long Term Goals - 06/08/20 1254      PT LONG TERM GOAL #1   Title Pt will be independent with progression of HEP for improved strength, balance, transfers, and gait.  TARGET 09/01/2020    Baseline No current HEP    Time 12    Period Weeks    Status New      PT LONG TERM GOAL #2   Title Pt will improve 5x sit<>stand to less than or equal to 10 sec to demonstrate improved functional strength and transfer efficiency.    Baseline 13.16 sec    Time 13.16    Period Weeks    Status New      PT LONG TERM GOAL #3   Title Pt will improve DGI score to at least 20/24 to decrease fall risk.    Baseline 18/24 at eval    Time 12    Period Weeks    Status New      PT LONG TERM GOAL #4   Title Pt will negotiate at least 12 steps with UE support of rail, mod I, alternating pattern, for improved stair negotiation.    Baseline supervision alternating pattern, more trouble reported descending steps    Time 12    Period Weeks    Status New      PT LONG TERM GOAL #5   Title Pt will ambulate at least 1000 ft independently, indoor/outdoor surfaces, with appropriate assistive device/orthotic for imrpoved community ambulationg    Baseline 250 ft indoors gait supervision     Time 12  Period Weeks    Status New                 Plan - 06/29/20 1106    Clinical Impression Statement Today's skilled session continued with use of Bioness concurrent with strengthening and balance activities with no issues noted or reported. Skin intact after use of Bioness. The pt is progressing toward goals and should benefit from continued PT to progress toward unmet goals.    Personal Factors and Comorbidities Comorbidity 3+    Comorbidities PMH:  anemia with pregnancy, MS, occiptial neuralgia, SMA (pt reports she is a carrier, no sx)    Examination-Activity Limitations Locomotion Level;Transfers;Stairs;Caring for Others    Examination-Participation Restrictions Community Activity;Other   Exercise activities   Stability/Clinical Decision Making Evolving/Moderate complexity    Rehab Potential Good    PT Frequency 1x / week    PT Duration 12 weeks   plus eval   PT Treatment/Interventions ADLs/Self Care Home Management;Electrical Stimulation;DME Instruction;Gait training;Stair training;Functional mobility training;Therapeutic activities;Therapeutic exercise;Balance training;Neuromuscular re-education;Orthotic Fit/Training;Patient/family education    PT Next Visit Plan continue with Bioness with strengthening/balance toward LTGs; work on dynamic gait and high level balance activities as well to progress toward pt goal of jogging. AFO consult with Thayer Ohm once pt's LE's are stronger.    PT Home Exercise Plan Access Code: [email or web address]    Consulted and Agree with Plan of Care Patient           Patient will benefit from skilled therapeutic intervention in order to improve the following deficits and impairments:  Abnormal gait,Difficulty walking,Decreased balance,Decreased mobility,Decreased strength  Visit Diagnosis: Other abnormalities of gait and mobility  Muscle weakness (generalized)  Unsteadiness on feet     Problem List Patient Active Problem List   Diagnosis Date  Noted  . Term pregnancy 05/10/2019  . Cervical insufficiency during pregnancy in second trimester, antepartum 05/23/2018  . Pregnancy 08/24/2017  . Lower respiratory infection 05/21/2017  . Fever 05/21/2017  . History of multiple sclerosis (HCC) 05/21/2017  . Flu-like symptoms 05/21/2017    Sallyanne Kuster 06/30/2020, 12:03 PM  [facility] [address] [place], [place], [address] Phone: [phone]   Fax:  [phone]  Name: Birdell Frasier MRN: [id number] Date of Birth: January 14, 1991

## 2020-07-06 ENCOUNTER — Ambulatory Visit: Payer: Medicaid Other | Admitting: Physical Therapy

## 2020-07-13 ENCOUNTER — Encounter: Payer: Self-pay | Admitting: Physical Therapy

## 2020-07-13 ENCOUNTER — Ambulatory Visit: Payer: Medicaid Other | Attending: Neurology | Admitting: Physical Therapy

## 2020-07-13 ENCOUNTER — Other Ambulatory Visit: Payer: Self-pay

## 2020-07-13 DIAGNOSIS — M6281 Muscle weakness (generalized): Secondary | ICD-10-CM | POA: Diagnosis present

## 2020-07-13 DIAGNOSIS — R2689 Other abnormalities of gait and mobility: Secondary | ICD-10-CM

## 2020-07-13 DIAGNOSIS — R2681 Unsteadiness on feet: Secondary | ICD-10-CM

## 2020-07-14 NOTE — Therapy (Signed)
[facility] [address] [place], [place], [address] Phone: [phone]   Fax:  [phone]  Physical Therapy Treatment  Patient Details  Name: Kimberly Strickland MRN: [id number] Date of Birth: 09/21/1990 Referring Provider (PT): Dr. Annamaria Helling   Encounter Date: 07/13/2020   PT End of Session - 07/13/20 1022    Visit Number 5    Number of Visits 13    Date for PT Re-Evaluation 09/08/20    Authorization Type [place] [employer] - 12 PT visits from 06/08/2020 through 09/08/20    Authorization - Visit Number 4    Authorization - Number of Visits 12    PT Start Time 1020    PT Stop Time 1100    PT Time Calculation (min) 40 min    Equipment Utilized During Treatment Other (comment)   Bioness   Activity Tolerance Patient tolerated treatment well    Behavior During Therapy [facility] for tasks assessed/performed           Past Medical History:  Diagnosis Date  . Anemia    with pregnancy  . Family history of adverse reaction to anesthesia    mother  "drops her heart rate"  . History of abnormal cervical Pap smear   . History of incompetent cervix, currently pregnant   . MS (multiple sclerosis) [facility])    neurologist-- dr Marnee Spring @ [facility]---  relapsing type,  (11-13-2018 per pt currently no treatment due to pregnancy)  . Occipital neuralgia   . SMA (spinal muscular atrophy) ([place])     Past Surgical History:  Procedure Laterality Date  . CERVICAL CERCLAGE N/A 11/17/2018   Procedure: CERCLAGE CERVICAL;  Surgeon: [name], DO;  Location: [place];  Service: Gynecology;  Laterality: N/A;  . ESOPHAGOGASTRODUODENOSCOPY (EGD) WITH PROPOFOL  2017  approx.    There were no vitals filed for this visit.   Subjective Assessment - 07/13/20 1022    Subjective No new complaints. No falls or pain to report.    Pertinent History PMH:  anemia with pregnancy, MS, occiptial neuralgia, SMA?    Patient  Stated Goals Pt's goal is to be able to jog and stay active.    Currently in Pain? No/denies                 [facility] Adult PT Treatment/Exercise - 07/13/20 1022      Transfers   Transfers Sit to Stand;Stand to Sit    Sit to Stand 6: Modified independent (Device/Increase time)    Five time sit to stand comments  11.00 sec's no UE support from standard height chair    Stand to Sit 6: Modified independent (Device/Increase time)      Ambulation/Gait   Ambulation/Gait Yes    Ambulation/Gait Assistance 6: Modified independent (Device/Increase time)    Ambulation/Gait Assistance Details gait concurrent with Bioness in session with no issues noted or reported.    Ambulation Distance (Feet) 345 Feet   x1 no Bioness; remainder of session with Bioness.   Assistive device None;Other (Comment)    Gait Pattern Step-through pattern;Wide base of support    Ambulation Surface Level;Indoor      Self-Care   Self-Care Other Self-Care Comments    Other Self-Care Comments  verbally reviewed HEP. No issues reported by patient and patient states the program is getting easier with some of them. Will benefit from advancement in the next visit or so. Did add wall squats to progrem verbally today.  Neuro Re-ed    Neuro Re-ed Details  for strengthening/NMR concurrent with Bioness to right LE: side stepping in squat position left<>right for 3 laps each way along ~20 foot pathway, supervision with cues to stay in squat form. then fwd/bwd diagonal stepping in squat position along ~20 foot pathway for 1 lap each way with min guard assist due to reported LE fatigue; wall squats for 5 sec hold, 8 sec rest x 10 reps. Added this to HEP.      Knee/Hip Exercises: Aerobic   Elliptical concurrent with Bioness to right LE: for 2 minutes forward, then 2 minutes backwards with supervision and bil UE support.      Acupuncturist Location R anterior tib and R quad/hip flexor     Electrical Stimulation Action for increased muscle activation and strengthening    Electrical Stimulation Parameters refer to tablet 1 for adjusted parameters. steering elecrodes    Electrical Stimulation Goals Strength;Neuromuscular facilitation                    PT Short Term Goals - 07/14/20 1438      PT SHORT TERM GOAL #1   Title Pt will be independent with HEP for improved strength, balance, transfers, and gait.  TARGET 07/07/2020    Baseline 07/13/20: met with current HEP    Status Achieved      PT SHORT TERM GOAL #2   Title Pt will improve 5x sit<>stand to less than or equal to 11.5 sec to demonstrate improved functional strength and transfer efficiency.    Baseline 07/13/20: 11.00 sec's no UE support from standard height surface    Time 4    Status Achieved      PT SHORT TERM GOAL #3   Title Pt will ambulate at least 300 ft without evidence of foot drag, decreased swing through initiation on RLE, for improved gait efficiency and safety.    Baseline 07/13/20: met in session today    Status Achieved             PT Long Term Goals - 06/08/20 1254      PT LONG TERM GOAL #1   Title Pt will be independent with progression of HEP for improved strength, balance, transfers, and gait.  TARGET 09/01/2020    Baseline No current HEP    Time 12    Period Weeks    Status New      PT LONG TERM GOAL #2   Title Pt will improve 5x sit<>stand to less than or equal to 10 sec to demonstrate improved functional strength and transfer efficiency.    Baseline 13.16 sec    Time 13.16    Period Weeks    Status New      PT LONG TERM GOAL #3   Title Pt will improve DGI score to at least 20/24 to decrease fall risk.    Baseline 18/24 at eval    Time 12    Period Weeks    Status New      PT LONG TERM GOAL #4   Title Pt will negotiate at least 12 steps with UE support of rail, mod I, alternating pattern, for improved stair negotiation.    Baseline supervision alternating pattern, more  trouble reported descending steps    Time 12    Period Weeks    Status New      PT LONG TERM GOAL #5   Title Pt will ambulate at least 1000  ft independently, indoor/outdoor surfaces, with appropriate assistive device/orthotic for imrpoved community ambulationg    Baseline 250 ft indoors gait supervision    Time 12    Period Weeks    Status New                 Plan - 07/13/20 1022    Clinical Impression Statement Today's skilled session initially focused on progress toward STGs with 3/3 goals met. Remainder of session continued with use of Bioness concurrent with gait and strengthening with no issues noted or reported in session. The pt is making steady progress toward goals and should benefit from continued PT to progress toward unmet goals.    Personal Factors and Comorbidities Comorbidity 3+    Comorbidities PMH:  anemia with pregnancy, MS, occiptial neuralgia, SMA (pt reports she is a carrier, no sx)    Examination-Activity Limitations Locomotion Level;Transfers;Stairs;Caring for Others    Examination-Participation Restrictions Community Activity;Other   Exercise activities   Stability/Clinical Decision Making Evolving/Moderate complexity    Rehab Potential Good    PT Frequency 1x / week    PT Duration 12 weeks   plus eval   PT Treatment/Interventions ADLs/Self Care Home Management;Electrical Stimulation;DME Instruction;Gait training;Stair training;Functional mobility training;Therapeutic activities;Therapeutic exercise;Balance training;Neuromuscular re-education;Orthotic Fit/Training;Patient/family education    PT Next Visit Plan continue with Bioness with strengthening/balance toward LTGs; work on dynamic gait and high level balance activities as well to progress toward pt goal of jogging. AFO consult with Gerald Stabs once pt's LE's are stronger.    PT Home Exercise Plan Access Code: [email or web address]    Consulted and Agree with Plan of Care Patient           Patient will benefit from  skilled therapeutic intervention in order to improve the following deficits and impairments:  Abnormal gait,Difficulty walking,Decreased balance,Decreased mobility,Decreased strength  Visit Diagnosis: Other abnormalities of gait and mobility  Muscle weakness (generalized)  Unsteadiness on feet     Problem List Patient Active Problem List   Diagnosis Date Noted  . Term pregnancy 05/10/2019  . Cervical insufficiency during pregnancy in second trimester, antepartum 05/23/2018  . Pregnancy 08/24/2017  . Lower respiratory infection 05/21/2017  . Fever 05/21/2017  . History of multiple sclerosis ([place]) 05/21/2017  . Flu-like symptoms 05/21/2017    Willow Ora, PTA, [facility] [address] [place], [place] [address] [phone] 07/14/20, 2:42 PM   Name: Kimberly Strickland MRN: [id number] Date of Birth: Jun 10, 1990

## 2020-07-20 ENCOUNTER — Ambulatory Visit: Payer: Medicaid Other | Admitting: Physical Therapy

## 2020-07-20 ENCOUNTER — Other Ambulatory Visit: Payer: Self-pay

## 2020-07-20 ENCOUNTER — Encounter: Payer: Self-pay | Admitting: Physical Therapy

## 2020-07-20 DIAGNOSIS — R2681 Unsteadiness on feet: Secondary | ICD-10-CM

## 2020-07-20 DIAGNOSIS — M6281 Muscle weakness (generalized): Secondary | ICD-10-CM

## 2020-07-20 DIAGNOSIS — R2689 Other abnormalities of gait and mobility: Secondary | ICD-10-CM | POA: Diagnosis not present

## 2020-07-21 NOTE — Therapy (Signed)
[facility] [address] [place], [place], [address] Phone: [phone]   Fax:  [phone]  Physical Therapy Treatment  Patient Details  Name: Kimberly Strickland MRN: [id number] Date of Birth: 07-05-1990 Referring Provider (PT): Dr. Annamaria Helling   Encounter Date: 07/20/2020     07/20/20 0935  PT Visits / Re-Eval  Visit Number 6  Number of Visits 13  Date for PT Re-Evaluation 09/08/20  Authorization  Authorization Type [place] [employer] - 12 PT visits from 06/08/2020 through 09/08/20  Authorization - Visit Number 5  Authorization - Number of Visits 12  PT Time Calculation  PT Start Time 0933  PT Stop Time 1015  PT Time Calculation (min) 42 min  PT - End of Session  Equipment Utilized During Treatment Other (comment) (Bioness)  Activity Tolerance Patient tolerated treatment well  Behavior During Therapy [facility] for tasks assessed/performed    Past Medical History:  Diagnosis Date  . Anemia    with pregnancy  . Family history of adverse reaction to anesthesia    mother  "drops her heart rate"  . History of abnormal cervical Pap smear   . History of incompetent cervix, currently pregnant   . MS (multiple sclerosis) [facility])    neurologist-- dr Marnee Spring @ [facility]---  relapsing type,  (11-13-2018 per pt currently no treatment due to pregnancy)  . Occipital neuralgia   . SMA (spinal muscular atrophy) ([place])     Past Surgical History:  Procedure Laterality Date  . CERVICAL CERCLAGE N/A 11/17/2018   Procedure: CERCLAGE CERVICAL;  Surgeon: [name], DO;  Location: [facility];  Service: Gynecology;  Laterality: N/A;  . ESOPHAGOGASTRODUODENOSCOPY (EGD) WITH PROPOFOL  2017  approx.    There were no vitals filed for this visit.     07/20/20 0935  Symptoms/Limitations  Subjective No new complaints. No falls or pain to report. Did have some right ankle swelling from overdoing it with  home workouts, has been taking it easy and its better now.  Pertinent History PMH:  anemia with pregnancy, MS, occiptial neuralgia, SMA?  Patient Stated Goals Pt's goal is to be able to jog and stay active.  Pain Assessment  Currently in Pain? No/denies        07/20/20 0936  Transfers  Transfers Sit to Stand;Stand to Sit  Sit to Stand 6: Modified independent (Device/Increase time)  Stand to Sit 6: Modified independent (Device/Increase time)  Ambulation/Gait  Ambulation/Gait Yes  Ambulation/Gait Assistance 6: Modified independent (Device/Increase time)  Ambulation/Gait Assistance Details concurrent with Bioness to right LE with gait around session  Assistive device None;Other (Comment)  Gait Pattern Step-through pattern;Wide base of support  Ambulation Surface Level;Indoor  High Level Balance  High Level Balance Comments concurent with Bioness to right LE- side stepping in squat position, then  fwd/bwd diagonal stepping in squat position for 3 laps each/each way with cues on form and technique. occasional support on bars for balance as needed with min guard assist for safety.  Neuro Re-ed   Neuro Re-ed Details  concurrent with bioness to right LE: standing across blue foam beam- alternating heel taps forwerd to floor with on time, rest on beam with off time, for ~6 reps each side, light UE support on bars as needed with min guard to min assist for balance.  Knee/Hip Exercises: Aerobic  Elliptical concurrent with Bioness to right LE: for 2 minutes forward, then 2 minutes backwards with supervision and bil UE support.  Programme researcher, broadcasting/film/video Location R anterior tib and R quad/hip flexor  Electrical Stimulation Action for increased muscle activation and strengthening  Electrical Stimulation Parameters refer to tablet 1 for adjusted parameters; steering electrodes.  Electrical Stimulation Goals Strength;Neuromuscular facilitation        PT Short Term Goals -  07/14/20 1438      PT SHORT TERM GOAL #1   Title Pt will be independent with HEP for improved strength, balance, transfers, and gait.  TARGET 07/07/2020    Baseline 07/13/20: met with current HEP    Status Achieved      PT SHORT TERM GOAL #2   Title Pt will improve 5x sit<>stand to less than or equal to 11.5 sec to demonstrate improved functional strength and transfer efficiency.    Baseline 07/13/20: 11.00 sec's no UE support from standard height surface    Time 4    Status Achieved      PT SHORT TERM GOAL #3   Title Pt will ambulate at least 300 ft without evidence of foot drag, decreased swing through initiation on RLE, for improved gait efficiency and safety.    Baseline 07/13/20: met in session today    Status Achieved             PT Long Term Goals - 06/08/20 1254      PT LONG TERM GOAL #1   Title Pt will be independent with progression of HEP for improved strength, balance, transfers, and gait.  TARGET 09/01/2020    Baseline No current HEP    Time 12    Period Weeks    Status New      PT LONG TERM GOAL #2   Title Pt will improve 5x sit<>stand to less than or equal to 10 sec to demonstrate improved functional strength and transfer efficiency.    Baseline 13.16 sec    Time 13.16    Period Weeks    Status New      PT LONG TERM GOAL #3   Title Pt will improve DGI score to at least 20/24 to decrease fall risk.    Baseline 18/24 at eval    Time 12    Period Weeks    Status New      PT LONG TERM GOAL #4   Title Pt will negotiate at least 12 steps with UE support of rail, mod I, alternating pattern, for improved stair negotiation.    Baseline supervision alternating pattern, more trouble reported descending steps    Time 12    Period Weeks    Status New      PT LONG TERM GOAL #5   Title Pt will ambulate at least 1000 ft independently, indoor/outdoor surfaces, with appropriate assistive device/orthotic for imrpoved community ambulationg    Baseline 250 ft indoors gait  supervision    Time 12    Period Weeks    Status New             07/20/20 0936  Plan  Clinical Impression Statement Today's skilled session continued to focus on use of Bioness with strengthening and balance ex's in session today. No issues were noted or reported with session. The pt is making steady progress and should benefit from continued PT to progress toward unmet goals.  Personal Factors and Comorbidities Comorbidity 3+  Comorbidities PMH:  anemia with pregnancy, MS, occiptial neuralgia, SMA (pt reports she is a carrier, no sx)  Examination-Activity Limitations Locomotion Level;Transfers;Stairs;Caring for Others  Examination-Participation Restrictions Community  Activity;Other (Exercise activities)  Pt will benefit from skilled therapeutic intervention in order to improve on the following deficits Abnormal gait;Difficulty walking;Decreased balance;Decreased mobility;Decreased strength  Stability/Clinical Decision Making Evolving/Moderate complexity  Rehab Potential Good  PT Frequency 1x / week  PT Duration 12 weeks (plus eval)  PT Treatment/Interventions ADLs/Self Care Home Management;Electrical Stimulation;DME Instruction;Gait training;Stair training;Functional mobility training;Therapeutic activities;Therapeutic exercise;Balance training;Neuromuscular re-education;Orthotic Fit/Training;Patient/family education  PT Next Visit Plan continue with Bioness with strengthening/balance toward LTGs; work on dynamic gait and high level balance activities as well to progress toward pt goal of jogging. AFO consult with Gerald Stabs once pt's LE's are stronger.  PT Home Exercise Plan Access Code: [email or web address]  Consulted and Agree with Plan of Care Patient          Patient will benefit from skilled therapeutic intervention in order to improve the following deficits and impairments:  Abnormal gait,Difficulty walking,Decreased balance,Decreased mobility,Decreased strength  Visit Diagnosis: Other  abnormalities of gait and mobility  Muscle weakness (generalized)  Unsteadiness on feet     Problem List Patient Active Problem List   Diagnosis Date Noted  . Term pregnancy 05/10/2019  . Cervical insufficiency during pregnancy in second trimester, antepartum 05/23/2018  . Pregnancy 08/24/2017  . Lower respiratory infection 05/21/2017  . Fever 05/21/2017  . History of multiple sclerosis ([place]) 05/21/2017  . Flu-like symptoms 05/21/2017    Willow Ora, PTA, [facility] [address], [place], [place] [address] [phone] 07/23/20, 9:13 PM   Name: Tangelia Sanson MRN: [id number] Date of Birth: 1991/03/03

## 2020-07-27 ENCOUNTER — Ambulatory Visit: Payer: Medicaid Other | Admitting: Physical Therapy

## 2020-08-03 ENCOUNTER — Ambulatory Visit: Payer: Medicaid Other | Admitting: Physical Therapy

## 2020-08-03 ENCOUNTER — Other Ambulatory Visit: Payer: Self-pay

## 2020-08-03 ENCOUNTER — Encounter: Payer: Self-pay | Admitting: Physical Therapy

## 2020-08-03 DIAGNOSIS — R2689 Other abnormalities of gait and mobility: Secondary | ICD-10-CM

## 2020-08-03 DIAGNOSIS — M6281 Muscle weakness (generalized): Secondary | ICD-10-CM

## 2020-08-03 DIAGNOSIS — R2681 Unsteadiness on feet: Secondary | ICD-10-CM

## 2020-08-04 NOTE — Therapy (Signed)
[facility] [address] [place], [place], [address] Phone: [phone]   Fax:  [phone]  Physical Therapy Treatment  Patient Details  Name: Kimberly Strickland MRN: [id number] Date of Birth: [birth year] Referring Provider (PT): Dr. Annamaria Helling   Encounter Date: 08/03/2020   PT End of Session - 08/04/20 0827    Visit Number 7    Number of Visits 13    Date for PT Re-Evaluation 09/08/20    Authorization Type [place] Medicaid [employer] - 12 PT visits from 06/08/2020 through 09/08/20    Authorization - Visit Number 6    Authorization - Number of Visits 12    PT Start Time 9379    PT Stop Time 1016    PT Time Calculation (min) 42 min    Equipment Utilized During Treatment Other (comment)   Bioness   Activity Tolerance Patient tolerated treatment well    Behavior During Therapy [facility] for tasks assessed/performed           Past Medical History:  Diagnosis Date  . Anemia    with pregnancy  . Family history of adverse reaction to anesthesia    mother  "drops her heart rate"  . History of abnormal cervical Pap smear   . History of incompetent cervix, currently pregnant   . MS (multiple sclerosis) [facility])    neurologist-- dr Marnee Spring @ [facility]---  relapsing type,  (11-13-2018 per pt currently no treatment due to pregnancy)  . Occipital neuralgia   . SMA (spinal muscular atrophy) ([place])     Past Surgical History:  Procedure Laterality Date  . CERVICAL CERCLAGE N/A 11/17/2018   Procedure: CERCLAGE CERVICAL;  Surgeon: [name], DO;  Location: [facility];  Service: Gynecology;  Laterality: N/A;  . ESOPHAGOGASTRODUODENOSCOPY (EGD) WITH PROPOFOL  2017  approx.    There were no vitals filed for this visit.   Subjective Assessment - 08/03/20 0938    Subjective Didn't sleep too well last night, Feel a little weak.  Have been busy, and had to cancel last week, as we will be moving to [place] early in  June.    Pertinent History PMH:  anemia with pregnancy, MS, occiptial neuralgia, SMA?    Patient Stated Goals Pt's goal is to be able to jog and stay active.    Currently in Pain? No/denies                             [facility] Adult PT Treatment/Exercise - 08/03/20 0935      Transfers   Transfers Sit to Stand;Stand to Sit    Sit to Stand 6: Modified independent (Device/Increase time)    Stand to Sit 6: Modified independent (Device/Increase time)    Comments RLE tucked posteriorly, from mat surface, concurrent with Bioness stim on/off time in training mode.      Ambulation/Gait   Ambulation/Gait Yes    Ambulation/Gait Assistance 6: Modified independent (Device/Increase time)    Ambulation/Gait Assistance Details Concurrent with Bioness to RLE in gym, level surfaces, with cues to initiate gait with increased hip flexion for improved swing through.    Ambulation Distance (Feet) 230 Feet   x 2, then 115 ft with Bioness removed at end of session.   Assistive device None;Other (Comment)   Bioness to RLE   Gait Pattern Step-through pattern;Wide base of support;Poor foot clearance - right    Ambulation Surface Level;Indoor  High Level Balance   High Level Balance Comments concurent with Bioness to RLE-side stepping in squat position along counter, 3 reps R and L, coordinating on/off times in training mode.      Knee/Hip Exercises: Aerobic   Elliptical concurrent with Bioness to RLE: Level 1.4, for 2 minutes forward, then 2 minutes backwards with supervision and bil UE support.      Modalities   Modalities Teacher, English as a foreign language Location R anterior tib and R quad/hip flexor    Electrical Stimulation Action for increased muscle activation and strengthening    Electrical Stimulation Parameters Refer to tablet 1 for parameters, sterring electrodes    Electrical Stimulation Goals Strength;Neuromuscular facilitation                   PT Education - 08/04/20 0825    Education Details POC, scheduling and therapy prior to move to [place]-pt agreeable to working through most of May; educated that she will likely need to establish with orthotist in [place] after her move for further orthotic consult at that time for bracing.    Person(s) Educated Patient    Methods Explanation    Comprehension Verbalized understanding            PT Short Term Goals - 08/04/20 0834      PT SHORT TERM GOAL #1   Title Pt will be independent with progression of HEP for improved strength, balance, transfers, and gait.  TARGET 08/11/2020    Baseline 07/13/20: met with initial HEP    Time 8    Period Weeks    Status Revised      PT SHORT TERM GOAL #2   Title Pt will improve 5x sit<>stand to less than or equal to 10 sec to demonstrate improved functional strength and transfer efficiency.    Baseline 07/13/20: 11.00 sec's no UE support from standard height surface    Time 8    Period Weeks    Status Revised      PT SHORT TERM GOAL #3   Title Pt will ambulate at least 500 ft without evidence of foot drag, decreased swing through initiation on RLE, for improved gait efficiency and safety.    Baseline 07/13/20: met in session today for 300 ft.    Time 8    Status Revised             PT Long Term Goals - 08/04/20 0837      PT LONG TERM GOAL #1   Title Pt will be independent with progression of HEP for improved strength, balance, transfers, and gait.  TARGET 09/01/2020    Baseline No current HEP    Time 12    Period Weeks    Status New      PT LONG TERM GOAL #2   Title Pt will improve 5x sit<>stand to less than or equal to 8 sec to demonstrate improved functional strength and transfer efficiency.    Baseline 13.16 sec    Time 13.16    Period Weeks    Status Revised      PT LONG TERM GOAL #3   Title Pt will improve DGI score to at least 20/24 to decrease fall risk.    Baseline 18/24 at eval    Time 12    Period Weeks     Status New      PT LONG TERM GOAL #4   Title Pt will negotiate at  least 12 steps with UE support of rail, mod I, alternating pattern, for improved stair negotiation.    Baseline supervision alternating pattern, more trouble reported descending steps    Time 12    Period Weeks    Status New      PT LONG TERM GOAL #5   Title Pt will ambulate at least 1000 ft independently, indoor/outdoor surfaces, with appropriate assistive device/orthotic for imrpoved community ambulationg    Baseline 250 ft indoors gait supervision    Time 12    Period Weeks    Status New                 Plan - 08/04/20 0830    Clinical Impression Statement Continued use of Bioness for functional e-stim to R lower extremity for improved muscle strengthening to improve gait pattern.  PT provides cues during gait for increased attention to R hip flexion for improved swing through to avoid possibility of RLE catching with gait pattern.  Pt does report that she feels carryover with gait after Bioness removed, but then recurvatum noted R knee.  Pt will continue to benefit from skilled PT to further address gait and strength; noted updated STGs  for 8 weeks timeframe.    Personal Factors and Comorbidities Comorbidity 3+    Comorbidities PMH:  anemia with pregnancy, MS, occiptial neuralgia, SMA (pt reports she is a carrier, no sx)    Examination-Activity Limitations Locomotion Level;Transfers;Stairs;Caring for Others    Examination-Participation Restrictions Community Activity;Other   Exercise activities   Stability/Clinical Decision Making Evolving/Moderate complexity    Rehab Potential Good    PT Frequency 1x / week    PT Duration 12 weeks   plus eval   PT Treatment/Interventions ADLs/Self Care Home Management;Electrical Stimulation;DME Instruction;Gait training;Stair training;Functional mobility training;Therapeutic activities;Therapeutic exercise;Balance training;Neuromuscular re-education;Orthotic  Fit/Training;Patient/family education    PT Next Visit Plan continue with Bioness with strengthening/balance toward LTGs; work on hip flexion activities to help with swing through and step-ups/terminal knee extension (add to HEP if needed for further work outside of therapy sessions)  work on dynamic gait and high level balance activities as well to progress toward pt goal of jogging. AFO consult with Gerald Stabs once pt's LE's are stronger. *Please note, pt is planning to move to [place] in early June and felt she can commit to PT through May.  Right now she is scheduled through 5/19. She will likely need to establish care at orthotist in [place] after her move for optimal AFO followup.    PT Home Exercise Plan Access Code: [email or web address]    Consulted and Agree with Plan of Care Patient           Patient will benefit from skilled therapeutic intervention in order to improve the following deficits and impairments:  Abnormal gait,Difficulty walking,Decreased balance,Decreased mobility,Decreased strength  Visit Diagnosis: Muscle weakness (generalized)  Other abnormalities of gait and mobility  Unsteadiness on feet     Problem List Patient Active Problem List   Diagnosis Date Noted  . Term pregnancy 05/10/2019  . Cervical insufficiency during pregnancy in second trimester, antepartum 05/23/2018  . Pregnancy 08/24/2017  . Lower respiratory infection 05/21/2017  . Fever 05/21/2017  . History of multiple sclerosis ([place]) 05/21/2017  . Flu-like symptoms 05/21/2017    Trinh Sanjose W. 08/04/2020, 8:46 AM  Frazier Butt., PT   [facility] [address] [place], [place], [address] Phone: [phone]   Fax:  [phone]  Name: Diana Davenport MRN: [id number] Date  of Birth: 22-Jul-1990

## 2020-08-10 ENCOUNTER — Other Ambulatory Visit: Payer: Self-pay

## 2020-08-10 ENCOUNTER — Ambulatory Visit: Payer: Medicaid Other | Attending: Neurology | Admitting: Physical Therapy

## 2020-08-10 ENCOUNTER — Encounter: Payer: Self-pay | Admitting: Physical Therapy

## 2020-08-10 DIAGNOSIS — R2689 Other abnormalities of gait and mobility: Secondary | ICD-10-CM | POA: Diagnosis present

## 2020-08-10 DIAGNOSIS — M6281 Muscle weakness (generalized): Secondary | ICD-10-CM | POA: Insufficient documentation

## 2020-08-10 DIAGNOSIS — R2681 Unsteadiness on feet: Secondary | ICD-10-CM

## 2020-08-10 NOTE — Therapy (Signed)
[facility] [address] [place], [place], [address] Phone: [phone]   Fax:  [phone]  Physical Therapy Treatment  Patient Details  Name: Kimberly Strickland MRN: [id number] Date of Birth: [birth year] Referring Provider (PT): Dr. Annamaria Helling   Encounter Date: 08/10/2020   PT End of Session - 08/10/20 0938    Visit Number 8    Number of Visits 13    Date for PT Re-Evaluation 09/08/20    Authorization Type [place] [employer] - 12 PT visits from 06/08/2020 through 09/08/20    Authorization - Visit Number 7    Authorization - Number of Visits 12    PT Start Time 6962    PT Stop Time 1015    PT Time Calculation (min) 40 min    Equipment Utilized During Treatment Other (comment)   Bioness   Activity Tolerance Patient tolerated treatment well    Behavior During Therapy [facility] for tasks assessed/performed           Past Medical History:  Diagnosis Date  . Anemia    with pregnancy  . Family history of adverse reaction to anesthesia    mother  "drops her heart rate"  . History of abnormal cervical Pap smear   . History of incompetent cervix, currently pregnant   . MS (multiple sclerosis) [facility])    neurologist-- dr Marnee Spring @ [facility]---  relapsing type,  (11-13-2018 per pt currently no treatment due to pregnancy)  . Occipital neuralgia   . SMA (spinal muscular atrophy) ([place])     Past Surgical History:  Procedure Laterality Date  . CERVICAL CERCLAGE N/A 11/17/2018   Procedure: CERCLAGE CERVICAL;  Surgeon: [name], DO;  Location: [place];  Service: Gynecology;  Laterality: N/A;  . ESOPHAGOGASTRODUODENOSCOPY (EGD) WITH PROPOFOL  2017  approx.    There were no vitals filed for this visit.   Subjective Assessment - 08/10/20 0937    Subjective No new complaitns. No falls or pain to report.    Pertinent History PMH:  anemia with pregnancy, MS, occiptial neuralgia, SMA?    Patient  Stated Goals Pt's goal is to be able to jog and stay active.    Currently in Pain? No/denies                 [facility] Adult PT Treatment/Exercise - 08/10/20 0940      Transfers   Transfers Sit to Stand;Stand to Sit    Sit to Stand 6: Modified independent (Device/Increase time)    Five time sit to stand comments  11.35 sec's no UE support from standard height surface    Stand to Sit 6: Modified independent (Device/Increase time)      Ambulation/Gait   Ambulation/Gait Yes    Ambulation/Gait Assistance 6: Modified independent (Device/Increase time)    Ambulation/Gait Assistance Details pt with 2 episodes of right foot scuffing with the last 50 feet of the 500 feet, self correction with stepping strategy to catch herself, no brace or Bioness on with gait gait rep. With Bioness donned performed another 230 feet of gait, plus around gym with remainder of session with no foot/toe scuffing noted.    Ambulation Distance (Feet) 500 Feet   x1 no device; 230 x1, plus around gym with Bioness to right LE.   Assistive device None;Other (Comment)   Bioness to right LE.   Gait Pattern Step-through pattern;Wide base of support;Poor foot clearance - right    Ambulation Surface Level;Indoor  Self-Care   Self-Care Other Self-Care Comments    Other Self-Care Comments  discussed HEP. pt has a program, however is not compliant with program, reports gets most of her ex while playing with her children.      Exercises   Exercises Other Exercises    Other Exercises  concurrent with Boiness to right LE: wall squats for 5 sec holds, 10 sec rests for 15 reps. supervision for safety.      Acupuncturist Location R anterior tib and R quad/hip flexor    Electrical Stimulation Action for increased muscle activation and strengthening in both open and closed chain    Electrical Stimulation Parameters refer to tablet 1 for adjusted parameters; steering electrodes    Electrical  Stimulation Goals Strength;Neuromuscular facilitation               PT Short Term Goals - 08/10/20 0939      PT SHORT TERM GOAL #1   Title Pt will be independent with progression of HEP for improved strength, balance, transfers, and gait.  TARGET 08/11/2020    Baseline 08/10/20: has a program, however is not compliant with it. Mostly working on "things" with her kids.    Status Partially Met      PT SHORT TERM GOAL #2   Title Pt will improve 5x sit<>stand to less than or equal to 10 sec to demonstrate improved functional strength and transfer efficiency.    Baseline 08/10/20: 11.35 sec's no UE support from standard height surface, increased from last time of 11 sec's.    Time --    Period --    Status Not Met      PT SHORT TERM GOAL #3   Title Pt will ambulate at least 500 ft without evidence of foot drag, decreased swing through initiation on RLE, for improved gait efficiency and safety.    Baseline 45/5/22: pt with 2 episodes of right foot catching toward end of the 500 feet with pt self correction forward loss of balance each time    Time --    Status Not Met             PT Long Term Goals - 08/04/20 0837      PT LONG TERM GOAL #1   Title Pt will be independent with progression of HEP for improved strength, balance, transfers, and gait.  TARGET 09/01/2020    Baseline No current HEP    Time 12    Period Weeks    Status New      PT LONG TERM GOAL #2   Title Pt will improve 5x sit<>stand to less than or equal to 8 sec to demonstrate improved functional strength and transfer efficiency.    Baseline 13.16 sec    Time 13.16    Period Weeks    Status Revised      PT LONG TERM GOAL #3   Title Pt will improve DGI score to at least 20/24 to decrease fall risk.    Baseline 18/24 at eval    Time 12    Period Weeks    Status New      PT LONG TERM GOAL #4   Title Pt will negotiate at least 12 steps with UE support of rail, mod I, alternating pattern, for improved stair  negotiation.    Baseline supervision alternating pattern, more trouble reported descending steps    Time 12    Period Weeks    Status New  PT LONG TERM GOAL #5   Title Pt will ambulate at least 1000 ft independently, indoor/outdoor surfaces, with appropriate assistive device/orthotic for imrpoved community ambulationg    Baseline 250 ft indoors gait supervision    Time 12    Period Weeks    Status New                 Plan - 08/10/20 5929    Clinical Impression Statement Today's skilled session initally focused on progress toward STGs with HEP goal partially met and 5 time sit to stand/gait goals not met. Remainder of session continued with use of Bioness to right LE with improved foot clearance/knee control noted with remainder of session. The pt should benefit from continued PT to progress toward unmet goals.    Personal Factors and Comorbidities Comorbidity 3+    Comorbidities PMH:  anemia with pregnancy, MS, occiptial neuralgia, SMA (pt reports she is a carrier, no sx)    Examination-Activity Limitations Locomotion Level;Transfers;Stairs;Caring for Others    Examination-Participation Restrictions Community Activity;Other   Exercise activities   Stability/Clinical Decision Making Evolving/Moderate complexity    Rehab Potential Good    PT Frequency 1x / week    PT Duration 12 weeks   plus eval   PT Treatment/Interventions ADLs/Self Care Home Management;Electrical Stimulation;DME Instruction;Gait training;Stair training;Functional mobility training;Therapeutic activities;Therapeutic exercise;Balance training;Neuromuscular re-education;Orthotic Fit/Training;Patient/family education    PT Next Visit Plan continue with Bioness with strengthening/balance toward LTGs; work on hip flexion activities to help with swing through and step-ups/terminal knee extension (add to HEP if needed for further work outside of therapy sessions)  work on dynamic gait and high level balance activities  as well to progress toward pt goal of jogging. Pt wants to pursue getting brace here with Gerald Stabs at [facility] as she is unsure how long it will take her to get insurance in [place]. Will fax over paperwork needed from [place] and pt was given card to schedule an appt at [facility] for brace consult.    PT Home Exercise Plan Access Code: [email or web address]    Consulted and Agree with Plan of Care Patient           Patient will benefit from skilled therapeutic intervention in order to improve the following deficits and impairments:  Abnormal gait,Difficulty walking,Decreased balance,Decreased mobility,Decreased strength  Visit Diagnosis: Muscle weakness (generalized)  Other abnormalities of gait and mobility  Unsteadiness on feet     Problem List Patient Active Problem List   Diagnosis Date Noted  . Term pregnancy 05/10/2019  . Cervical insufficiency during pregnancy in second trimester, antepartum 05/23/2018  . Pregnancy 08/24/2017  . Lower respiratory infection 05/21/2017  . Fever 05/21/2017  . History of multiple sclerosis ([place]) 05/21/2017  . Flu-like symptoms 05/21/2017    Willow Ora, PTA, [facility] Outpatient Neuro [facility] [address] [place], [place] [address] [phone] 08/10/20, 3:59 PM   Name: Terence Googe MRN: [id number] Date of Birth: [birth year]

## 2020-08-11 ENCOUNTER — Encounter: Payer: Self-pay | Admitting: Physical Therapy

## 2020-08-11 NOTE — Therapy (Signed)
[facility] [address] [place], [place], [address] Phone: [phone]   Fax:  [phone]  Patient Details  Name: Kimberly Strickland MRN: [id number] Date of Birth: 05-06-1990 Referring Provider:  No ref. provider found  Encounter Date: 08/11/2020  Spoke with patient yesterday during PT session, and pt wants to pursue AFO option via [facility] prior to her move to [place].  PT called and spoke to [facility] at [place], who states to fax over information from MD face to face visit (05/22/2020) and PT eval/notes.  She requests that patient follow-up in their clinic with appointment to re-initiate AFO process.  PT made patient aware and she verbalized understanding.  PT faxed over the above information today to [facility].      Nayla Dias W. 08/11/2020, 2:16 PM  Gean Maidens., PT   [facility] [address] [place], [place], [address] Phone: [phone]   Fax:  [phone]

## 2020-08-15 ENCOUNTER — Encounter: Payer: Self-pay | Admitting: Physical Therapy

## 2020-08-15 ENCOUNTER — Other Ambulatory Visit: Payer: Self-pay

## 2020-08-15 ENCOUNTER — Ambulatory Visit: Payer: Medicaid Other | Admitting: Physical Therapy

## 2020-08-15 DIAGNOSIS — R2689 Other abnormalities of gait and mobility: Secondary | ICD-10-CM

## 2020-08-15 DIAGNOSIS — M6281 Muscle weakness (generalized): Secondary | ICD-10-CM

## 2020-08-15 NOTE — Patient Instructions (Addendum)
Foot Up Brace by [employer]  Size Large Cuff  [email or web address] or [employer] Code: [employer] URL: [email or web address] Date: 08/15/2020 Prepared by: Lonia Blood  Exercises Standing Repeated Hip Abduction with Resistance - 1 x daily - 5 x weekly - 1 sets - 10 reps Standing Hip Extension with Anchored Resistance - 1 x daily - 5 x weekly - 1 sets - 10 reps Standing Hip Flexion with Anchored Resistance and Chair Support - 1 x daily - 5 x weekly - 1 sets - 10 reps Standing Repeated Hip Adduction with Resistance - 1 x daily - 5 x weekly - 1 sets - 10 reps Added 08/15/2020 Standing Terminal Knee Extension with Resistance - 1 x daily - 3 x weekly - 1-2 sets - 10 reps - 3 sec hold

## 2020-08-15 NOTE — Therapy (Signed)
[facility] [address] [place], [place], [address] Phone: [phone]   Fax:  [phone]  Physical Therapy Treatment  Patient Details  Name: Kimberly Strickland MRN: [id number] Date of Birth: 1990-06-21 Referring Provider (PT): Dr. Annamaria Helling   Encounter Date: 08/15/2020   PT End of Session - 08/15/20 1658    Visit Number 9    Number of Visits 13    Date for PT Re-Evaluation 09/08/20    Authorization Type [place] Medicaid [employer] - 12 PT visits from 06/08/2020 through 09/08/20    Authorization - Visit Number 8    Authorization - Number of Visits 12    PT Start Time 9211    PT Stop Time 1015    PT Time Calculation (min) 41 min    Equipment Utilized During Treatment Other (comment)   trial of AFO, foot-up brace   Activity Tolerance Patient tolerated treatment well    Behavior During Therapy [facility] for tasks assessed/performed           Past Medical History:  Diagnosis Date  . Anemia    with pregnancy  . Family history of adverse reaction to anesthesia    mother  "drops her heart rate"  . History of abnormal cervical Pap smear   . History of incompetent cervix, currently pregnant   . MS (multiple sclerosis) [facility])    neurologist-- dr Marnee Spring @ [facility]---  relapsing type,  (11-13-2018 per pt currently no treatment due to pregnancy)  . Occipital neuralgia   . SMA (spinal muscular atrophy) ([place])     Past Surgical History:  Procedure Laterality Date  . CERVICAL CERCLAGE N/A 11/17/2018   Procedure: CERCLAGE CERVICAL;  Surgeon: [name], DO;  Location: [facility];  Service: Gynecology;  Laterality: N/A;  . ESOPHAGOGASTRODUODENOSCOPY (EGD) WITH PROPOFOL  2017  approx.    There were no vitals filed for this visit.   Subjective Assessment - 08/15/20 0936    Subjective Feel a little weaker today; doing a fast that ends tomorrow, so that is probably why.  Haven't called to [employer] yet, but  I will plan to today.    Pertinent History PMH:  anemia with pregnancy, MS, occiptial neuralgia, SMA?    Patient Stated Goals Pt's goal is to be able to jog and stay active.    Currently in Pain? No/denies                             [facility] Adult PT Treatment/Exercise - 08/15/20 0001      Ambulation/Gait   Ambulation/Gait Yes    Ambulation/Gait Assistance 5: Supervision;6: Modified independent (Device/Increase time)    Ambulation/Gait Assistance Details Trial of Walk-on PLS AFO on RLE x 115 ft, then no AFO, then foot-up brace x 230 ft, then no brace 115 ft.    Ambulation Distance (Feet) 115 Feet   bouts throughout session-see above   Assistive device None;Other (Comment)   trial of AFO/foot-up brace   Gait Pattern Step-through pattern;Wide base of support;Poor foot clearance - right   With AFO:  improved foot clearance, but increased knee flexion>recurvatum.  With foot-up brace:  improved foot clearance and heelstrike.   Ambulation Surface Level;Indoor    Gait Comments Discussed plans for pt to call to make appt with orthotist.  Pt agreeable to trialing AFO and foot up brace today.  Pt wearing smaller size, mesh sneaker today; therefore only trialed one  AFO, due to difficulty getting in her shoe.      Exercises   Exercises Other Exercises;Knee/Hip;Ankle      Knee/Hip Exercises: Stretches   Gastroc Stretch Right;Left;Both;3 reps;10 seconds    Gastroc Stretch Limitations Started with bth feet on step for bilat gastroc stretch, 3 x 10 sec; then RLE propped at step, 10 sec, 3 reps.      Knee/Hip Exercises: Standing   Terminal Knee Extension Strengthening;Right;3 sets;10 reps    Theraband Level (Terminal Knee Extension) Level 4 (Blue)    Terminal Knee Extension Limitations Performed in 3 positions:  feet wide BOS, stride stance RLE in front, stride stance with LLE in front then in back.  Cues for controll into extension and controlled eccentric phase to relax.                   PT Education - 08/15/20 1657    Education Details Discussed follow-up with orthotist for AFO; discussed trials of gait today, with foot-up brace (potential benefits and limitations)    Person(s) Educated Patient    Methods Explanation;Demonstration    Comprehension Verbalized understanding            PT Short Term Goals - 08/10/20 0939      PT SHORT TERM GOAL #1   Title Pt will be independent with progression of HEP for improved strength, balance, transfers, and gait.  TARGET 08/11/2020    Baseline 08/10/20: has a program, however is not compliant with it. Mostly working on "things" with her kids.    Status Partially Met      PT SHORT TERM GOAL #2   Title Pt will improve 5x sit<>stand to less than or equal to 10 sec to demonstrate improved functional strength and transfer efficiency.    Baseline 08/10/20: 11.35 sec's no UE support from standard height surface, increased from last time of 11 sec's.    Time --    Period --    Status Not Met      PT SHORT TERM GOAL #3   Title Pt will ambulate at least 500 ft without evidence of foot drag, decreased swing through initiation on RLE, for improved gait efficiency and safety.    Baseline 45/5/22: pt with 2 episodes of right foot catching toward end of the 500 feet with pt self correction forward loss of balance each time    Time --    Status Not Met             PT Long Term Goals - 08/04/20 0837      PT LONG TERM GOAL #1   Title Pt will be independent with progression of HEP for improved strength, balance, transfers, and gait.  TARGET 09/01/2020    Baseline No current HEP    Time 12    Period Weeks    Status New      PT LONG TERM GOAL #2   Title Pt will improve 5x sit<>stand to less than or equal to 8 sec to demonstrate improved functional strength and transfer efficiency.    Baseline 13.16 sec    Time 13.16    Period Weeks    Status Revised      PT LONG TERM GOAL #3   Title Pt will improve DGI score to at  least 20/24 to decrease fall risk.    Baseline 18/24 at eval    Time 12    Period Weeks    Status New      PT  LONG TERM GOAL #4   Title Pt will negotiate at least 12 steps with UE support of rail, mod I, alternating pattern, for improved stair negotiation.    Baseline supervision alternating pattern, more trouble reported descending steps    Time 12    Period Weeks    Status New      PT LONG TERM GOAL #5   Title Pt will ambulate at least 1000 ft independently, indoor/outdoor surfaces, with appropriate assistive device/orthotic for imrpoved community ambulationg    Baseline 250 ft indoors gait supervision    Time 12    Period Weeks    Status New                 Plan - 08/15/20 1659    Clinical Impression Statement As pt discussed with PTA last session, she does want to pursue trying to get AFO for RLE to assist with foot clearance and gait pattern prior to her move to [place].  With no AFO, pt tends to have occasional R foot drag, decreased foot clearance, decreased heelstrike.  Only able to trial one AFO today (due to pt's shoes, she is wearing flexible, tight sneakers and AFO is not fitting well in shoe).  Trialed walk-on PLS AFO, with improved foot clearance, but increased knee flexion>hyperextension moment.  Trialed foot-up brace, with improved foot clearance noted, but occasional hyperextenstion R knee.  Pt overall feels ready to pursue AFO options and plans to contact orthotist later today.  She will benefit from AFO to assist with foot clearance and heelstrike, timing and coordination of gait pattern, to improve overall gait pattern and decrease risk of falls.    Personal Factors and Comorbidities Comorbidity 3+    Comorbidities PMH:  anemia with pregnancy, MS, occiptial neuralgia, SMA (pt reports she is a carrier, no sx)    Examination-Activity Limitations Locomotion Level;Transfers;Stairs;Caring for Others    Examination-Participation Restrictions Community Activity;Other    Exercise activities   Stability/Clinical Decision Making Evolving/Moderate complexity    Rehab Potential Good    PT Frequency 1x / week    PT Duration 12 weeks   plus eval   PT Treatment/Interventions ADLs/Self Care Home Management;Electrical Stimulation;DME Instruction;Gait training;Stair training;Functional mobility training;Therapeutic activities;Therapeutic exercise;Balance training;Neuromuscular re-education;Orthotic Fit/Training;Patient/family education    PT Next Visit Plan Reviewe HEP addition from this visit; did pt contact [employer] for AFO?  Continue on dynamic gait and high level balance activities as well to progress toward pt goal of jogging. Pt's last scheduled PT appt is 5/19; if this is her last day due to moving, please check LTGs for d/c.  If not, schedule 1-2 more appts through 6/3.    PT Home Exercise Plan Access Code: [email or web address]    Consulted and Agree with Plan of Care Patient           Patient will benefit from skilled therapeutic intervention in order to improve the following deficits and impairments:  Abnormal gait,Difficulty walking,Decreased balance,Decreased mobility,Decreased strength  Visit Diagnosis: Other abnormalities of gait and mobility  Muscle weakness (generalized)     Problem List Patient Active Problem List   Diagnosis Date Noted  . Term pregnancy 05/10/2019  . Cervical insufficiency during pregnancy in second trimester, antepartum 05/23/2018  . Pregnancy 08/24/2017  . Lower respiratory infection 05/21/2017  . Fever 05/21/2017  . History of multiple sclerosis ([place]) 05/21/2017  . Flu-like symptoms 05/21/2017    Gwendolyn Mclees W. 08/15/2020, 5:06 PM  Frazier Butt., PT   [facility] [phone]  [place], [place], [address] Phone: [phone]   Fax:  [phone]  Name: Kimberly Strickland MRN: [id number] Date of Birth: 04-24-1990

## 2020-08-24 ENCOUNTER — Ambulatory Visit: Payer: Medicaid Other | Admitting: Physical Therapy

## 2020-08-24 ENCOUNTER — Other Ambulatory Visit: Payer: Self-pay

## 2020-08-24 ENCOUNTER — Encounter: Payer: Self-pay | Admitting: Physical Therapy

## 2020-08-24 DIAGNOSIS — M6281 Muscle weakness (generalized): Secondary | ICD-10-CM | POA: Diagnosis not present

## 2020-08-24 DIAGNOSIS — R2681 Unsteadiness on feet: Secondary | ICD-10-CM

## 2020-08-24 DIAGNOSIS — R2689 Other abnormalities of gait and mobility: Secondary | ICD-10-CM

## 2020-08-24 NOTE — Therapy (Signed)
[facility] [address] [place], [place], [address] Phone: [phone]   Fax:  [phone]  Physical Therapy Treatment  Patient Details  Name: Kimberly Strickland MRN: [id number] Date of Birth: January 22, 1991 Referring Provider (PT): Dr. Annamaria Helling   Encounter Date: 08/24/2020   PT End of Session - 08/24/20 0936    Visit Number 10    Number of Visits 13    Date for PT Re-Evaluation 09/08/20    Authorization Type [place] [employer] - 12 PT visits from 06/08/2020 through 09/08/20    Authorization - Visit Number 9    Authorization - Number of Visits 12    PT Start Time 0933    PT Stop Time 1000   discharge visit, not all time was needed   PT Time Calculation (min) 27 min    Equipment Utilized During Treatment Other (comment)   trial of AFO, foot-up brace   Activity Tolerance Patient tolerated treatment well    Behavior During Therapy [facility] for tasks assessed/performed           Past Medical History:  Diagnosis Date  . Anemia    with pregnancy  . Family history of adverse reaction to anesthesia    mother  "drops her heart rate"  . History of abnormal cervical Pap smear   . History of incompetent cervix, currently pregnant   . MS (multiple sclerosis) [facility])    neurologist-- dr Marnee Spring @ [facility]---  relapsing type,  (11-13-2018 per pt currently no treatment due to pregnancy)  . Occipital neuralgia   . SMA (spinal muscular atrophy) ([place])     Past Surgical History:  Procedure Laterality Date  . CERVICAL CERCLAGE N/A 11/17/2018   Procedure: CERCLAGE CERVICAL;  Surgeon: [name], DO;  Location: [place];  Service: Gynecology;  Laterality: N/A;  . ESOPHAGOGASTRODUODENOSCOPY (EGD) WITH PROPOFOL  2017  approx.    There were no vitals filed for this visit.   Subjective Assessment - 08/24/20 0934    Subjective Saw Gerald Stabs at [facility] yesterday. They found a brace that works for her and Gerald Stabs  is submitting to insurance at this time. Hoping to be able to get it in ~2 weeks. Today will be her last day as they are moving to [facility] tomorrow.    Pertinent History PMH:  anemia with pregnancy, MS, occiptial neuralgia, SMA?    Patient Stated Goals Pt's goal is to be able to jog and stay active.    Currently in Pain? No/denies              [facility] PT Assessment - 08/24/20 0937      Dynamic Gait Index   Level Surface Normal    Change in Gait Speed Normal    Gait with Horizontal Head Turns Normal    Gait with Vertical Head Turns Normal    Gait and Pivot Turn Normal    Step Over Obstacle Normal    Step Around Obstacles Normal    Steps Mild Impairment    Total Score 23    DGI comment: 23/24 scored today               [facility] Adult PT Treatment/Exercise - 08/24/20 0937      Transfers   Transfers Sit to Stand;Stand to Sit    Sit to Stand 6: Modified independent (Device/Increase time)    Five time sit to stand comments  11.25sec's no UE support from standard height surface  Stand to Sit 6: Modified independent (Device/Increase time)      Ambulation/Gait   Ambulation/Gait Yes    Ambulation/Gait Assistance 6: Modified independent (Device/Increase time)    Ambulation/Gait Assistance Details occasional foot scuffing with gait, no balance loss noted.    Ambulation Distance (Feet) 1000 Feet   x1, plus around gym with session   Assistive device None    Gait Pattern Step-through pattern;Wide base of support;Poor foot clearance - right    Ambulation Surface Level;Indoor    Stairs Yes    Stairs Assistance 6: Modified independent (Device/Increase time)    Stair Management Technique One rail Left;One rail Right;Alternating pattern;Forwards    Number of Stairs 4   x4 reps   Height of Stairs 6      Exercises   Exercises Other Exercises    Other Exercises  discussed HEP. Current program is going well and remains challenging.               PT Short Term Goals - 08/10/20 0939       PT SHORT TERM GOAL #1   Title Pt will be independent with progression of HEP for improved strength, balance, transfers, and gait.  TARGET 08/11/2020    Baseline 08/10/20: has a program, however is not compliant with it. Mostly working on "things" with her kids.    Status Partially Met      PT SHORT TERM GOAL #2   Title Pt will improve 5x sit<>stand to less than or equal to 10 sec to demonstrate improved functional strength and transfer efficiency.    Baseline 08/10/20: 11.35 sec's no UE support from standard height surface, increased from last time of 11 sec's.    Time --    Period --    Status Not Met      PT SHORT TERM GOAL #3   Title Pt will ambulate at least 500 ft without evidence of foot drag, decreased swing through initiation on RLE, for improved gait efficiency and safety.    Baseline 45/5/22: pt with 2 episodes of right foot catching toward end of the 500 feet with pt self correction forward loss of balance each time    Time --    Status Not Met             PT Long Term Goals - 08/24/20 0936      PT LONG TERM GOAL #1   Title Pt will be independent with progression of HEP for improved strength, balance, transfers, and gait.  TARGET 09/01/2020    Baseline 08/24/20: met with current HEP    Time --    Period --    Status Achieved      PT LONG TERM GOAL #2   Title Pt will improve 5x sit<>stand to less than or equal to 8 sec to demonstrate improved functional strength and transfer efficiency.    Baseline 15/19/22: 11.25 sec's no UE support from standard height surface, improved from 13.16 sec's just not to goal level.    Time --    Period --    Status Achieved      PT LONG TERM GOAL #3   Title Pt will improve DGI score to at least 20/24 to decrease fall risk.    Baseline 08/24/20: 23/24 scored today    Time --    Period --    Status Achieved      PT LONG TERM GOAL #4   Title Pt will negotiate at least 12 steps with UE  support of rail, mod I, alternating pattern, for  improved stair negotiation.    Baseline 08/24/20: met in session today    Time --    Period --    Status Achieved      PT LONG TERM GOAL #5   Title Pt will ambulate at least 1000 ft independently, indoor/outdoor surfaces, with appropriate assistive device/orthotic for imrpoved community ambulationg    Baseline 08/24/20: met in session today with no AD    Time --    Period --    Status Achieved                 Plan - 08/24/20 0936    Personal Factors and Comorbidities Comorbidity 3+    Comorbidities PMH:  anemia with pregnancy, MS, occiptial neuralgia, SMA (pt reports she is a carrier, no sx)    Examination-Activity Limitations Locomotion Level;Transfers;Stairs;Caring for Others    Examination-Participation Restrictions Community Activity;Other   Exercise activities   Stability/Clinical Decision Making Evolving/Moderate complexity    Rehab Potential Good    PT Frequency 1x / week    PT Duration 12 weeks   plus eval   PT Treatment/Interventions ADLs/Self Care Home Management;Electrical Stimulation;DME Instruction;Gait training;Stair training;Functional mobility training;Therapeutic activities;Therapeutic exercise;Balance training;Neuromuscular re-education;Orthotic Fit/Training;Patient/family education    PT Next Visit Plan Reviewe HEP addition from this visit; did pt contact [employer] for AFO?  Continue on dynamic gait and high level balance activities as well to progress toward pt goal of jogging. Pt's last scheduled PT appt is 5/19; if this is her last day due to moving, please check LTGs for d/c.  If not, schedule 1-2 more appts through 6/3.    PT Home Exercise Plan Access Code: [email or web address]    Consulted and Agree with Plan of Care Patient           Patient will benefit from skilled therapeutic intervention in order to improve the following deficits and impairments:  Abnormal gait,Difficulty walking,Decreased balance,Decreased mobility,Decreased strength  Visit Diagnosis: Other  abnormalities of gait and mobility  Muscle weakness (generalized)  Unsteadiness on feet     Problem List Patient Active Problem List   Diagnosis Date Noted  . Term pregnancy 05/10/2019  . Cervical insufficiency during pregnancy in second trimester, antepartum 05/23/2018  . Pregnancy 08/24/2017  . Lower respiratory infection 05/21/2017  . Fever 05/21/2017  . History of multiple sclerosis ([place]) 05/21/2017  . Flu-like symptoms 05/21/2017    Willow Ora, PTA, [facility] Outpatient Neuro [facility] [address] [place], [place] [address] [phone] 08/24/20, 10:12 AM   Name: Kimberly Strickland MRN: [id number] Date of Birth: 1991-03-11
